# Patient Record
Sex: Female | Born: 1990 | Hispanic: No | Marital: Single | Smoking: Never smoker
Health system: Southern US, Community
[De-identification: ages and names within clinical notes are randomized; demographics above are authoritative.]

## PROBLEM LIST (undated history)

## (undated) DIAGNOSIS — K219 Gastro-esophageal reflux disease without esophagitis: Secondary | ICD-10-CM

## (undated) DIAGNOSIS — T7840XA Allergy, unspecified, initial encounter: Secondary | ICD-10-CM

## (undated) DIAGNOSIS — K589 Irritable bowel syndrome without diarrhea: Secondary | ICD-10-CM

## (undated) HISTORY — DX: Allergy, unspecified, initial encounter: T78.40XA

## (undated) HISTORY — DX: Irritable bowel syndrome, unspecified: K58.9

---

## 2013-03-30 DIAGNOSIS — D649 Anemia, unspecified: Secondary | ICD-10-CM | POA: Insufficient documentation

## 2014-08-21 ENCOUNTER — Ambulatory Visit (INDEPENDENT_AMBULATORY_CARE_PROVIDER_SITE_OTHER): Payer: BC Managed Care – PPO | Admitting: Family Medicine

## 2014-08-21 VITALS — BP 106/70 | HR 94 | Temp 98.4°F | Resp 20 | Ht 63.25 in | Wt 167.1 lb

## 2014-08-21 DIAGNOSIS — S91009A Unspecified open wound, unspecified ankle, initial encounter: Secondary | ICD-10-CM

## 2014-08-21 DIAGNOSIS — IMO0002 Reserved for concepts with insufficient information to code with codable children: Secondary | ICD-10-CM

## 2014-08-21 DIAGNOSIS — S81801A Unspecified open wound, right lower leg, initial encounter: Principal | ICD-10-CM

## 2014-08-21 DIAGNOSIS — S91001A Unspecified open wound, right ankle, initial encounter: Principal | ICD-10-CM

## 2014-08-21 DIAGNOSIS — S81001A Unspecified open wound, right knee, initial encounter: Secondary | ICD-10-CM

## 2014-08-21 DIAGNOSIS — S81809A Unspecified open wound, unspecified lower leg, initial encounter: Secondary | ICD-10-CM

## 2014-08-21 DIAGNOSIS — S80829A Blister (nonthermal), unspecified lower leg, initial encounter: Secondary | ICD-10-CM

## 2014-08-21 DIAGNOSIS — S81009A Unspecified open wound, unspecified knee, initial encounter: Secondary | ICD-10-CM

## 2014-08-21 MED ORDER — SILVER SULFADIAZINE 1 % EX CREA: 1.0000 "application " | TOPICAL_CREAM | Freq: Every day | CUTANEOUS | Status: DC

## 2014-08-21 NOTE — Progress Notes (Signed)
Subjective:  This chart was scribed for Shade Flood, MD by Elveria Rising, Medial Scribe. This patient was seen in room 1 and the patient's care was started at 3:53 PM.    Patient ID: Catherine Freeman, female    DOB: July 27, 1991, 23 y.o.   MRN: [id number]  HPI HPI Comments: Catherine Freeman is a 23 y.o. female who presents to the Emergency Department complaining of leash burn to her right posterior leg at popliteal space incurred three days ago. Patient reports that she was walking her dog and he began chasing a squirrel. During the chase, his leash wrapped around her right leg twice resulting in two large superficial, open wounds. Patient reports treatment with peroxide, Neosporin, bandaging with gauze and ace wraps, and washing the wounds twice a day with soap and water. Patient has not taken pain medication because the pain is mild and tolerable. Patient is concerned about possible infection. Patient denies history MRSA.   There are no active problems to display for this patient.  Past Medical History  Diagnosis Date  . Allergy    History reviewed. No pertinent past surgical history. Allergies  Allergen Reactions  . Dust Mite Extract    Prior to Admission medications   Medication Sig Start Date End Date Taking? Authorizing Provider  cetirizine (ZYRTEC) 10 MG tablet Take 10 mg by mouth daily.   Yes Historical Provider, MD  norethindrone-ethinyl estradiol (JUNEL FE,GILDESS FE,LOESTRIN FE) 1-20 MG-MCG tablet Take 1 tablet by mouth daily.   Yes Historical Provider, MD   History   Social History  . Marital Status: Single    Spouse Name: N/A    Number of Children: N/A  . Years of Education: N/A   Occupational History  . Not on file.   Social History Main Topics  . Smoking status: Never Smoker   . Smokeless tobacco: Never Used  . Alcohol Use: No  . Drug Use: No  . Sexual Activity: Not on file   Other Topics Concern  . Not on file   Social History Narrative  . No narrative on  file    Review of Systems  Constitutional: Negative for fever and chills.  Skin: Positive for wound.  Neurological: Negative for numbness.      Objective:   Physical Exam  Nursing note and vitals reviewed. Constitutional: She is oriented to person, place, and time. She appears well-developed and well-nourished.  HENT:  Head: Normocephalic and atraumatic.  Eyes: EOM are normal.  Neck: Neck supple.  Cardiovascular: Normal rate.   Pulmonary/Chest: Effort normal.  Musculoskeletal: Normal range of motion.  Neurological: She is alert and oriented to person, place, and time.  Skin: Skin is warm and dry.  Posterior aspect in popliteal fossa: two abraisons with slight clear, adherent discharge. Upper wound is 10cm x 2 cm with few small faint lateral abrasions on lateral aspset.  Lower wound is approximately 18cm x 2cm with one small blister that is 1.5cm x 1cm on inferior lateral aspect.   Psychiatric: She has a normal mood and affect. Her behavior is normal.   Filed Vitals:   08/21/14 1503  BP: 106/70  Pulse: 94  Temp: 98.4 F (36.9 C)  TempSrc: Oral  Resp: 20  Height: 5' 3.25" (1.607 m)  Weight: 167 lb 2 oz (75.807 kg)  SpO2: 98%       Assessment & Plan:   Catherine Freeman is a 23 y.o. female Open wound of knee, leg (except thigh), and ankle, complicated, right, initial  encounter - Plan: Wound culture, silver sulfADIAZINE (SILVADENE) 1 % cream  Blister of leg - Plan: Wound culture, silver sulfADIAZINE (SILVADENE) 1 % cream  Leash wound/abrasion.  No surrounding erythema.  1 small bulla laterally opened with 11 blade, clear fluid expressed. Silvadene applied and bandaged. Wound care discussed below. rtc precautions.   Meds ordered this encounter  Medications  . cetirizine (ZYRTEC) 10 MG tablet    Sig: Take 10 mg by mouth daily.  . norethindrone-ethinyl estradiol (JUNEL FE,GILDESS FE,LOESTRIN FE) 1-20 MG-MCG tablet    Sig: Take 1 tablet by mouth daily.  . silver sulfADIAZINE  (SILVADENE) 1 % cream    Sig: Apply 1 application topically daily.    Dispense:  50 g    Refill:  0   Patient Instructions  Soap and water cleansing of area once per day, cover with silvadene cream and change bandage once per day. After 1 week, can change to polysporin once per day until healed.  If any redness around wound or worsening of area - recheck.   Return to the clinic or go to the nearest emergency room if any of your symptoms worsen or new symptoms occur. Abrasion An abrasion is a cut or scrape of the skin. Abrasions do not extend through all layers of the skin and most heal within 10 days. It is important to care for your abrasion properly to prevent infection. CAUSES  Most abrasions are caused by falling on, or gliding across, the ground or other surface. When your skin rubs on something, the outer and inner layer of skin rubs off, causing an abrasion. DIAGNOSIS  Your caregiver will be able to diagnose an abrasion during a physical exam.  TREATMENT  Your treatment depends on how large and deep the abrasion is. Generally, your abrasion will be cleaned with water and a mild soap to remove any dirt or debris. An antibiotic ointment may be put over the abrasion to prevent an infection. A bandage (dressing) may be wrapped around the abrasion to keep it from getting dirty.  You may need a tetanus shot if:  You cannot remember when you had your last tetanus shot.  You have never had a tetanus shot.  The injury broke your skin. If you get a tetanus shot, your arm may swell, get red, and feel warm to the touch. This is common and not a problem. If you need a tetanus shot and you choose not to have one, there is a rare chance of getting tetanus. Sickness from tetanus can be serious.  HOME CARE INSTRUCTIONS   If a dressing was applied, change it at least once a day or as directed by your caregiver. If the bandage sticks, soak it off with warm water.   Wash the area with water and a  mild soap to remove all the ointment 2 times a day. Rinse off the soap and pat the area dry with a clean towel.   Reapply any ointment as directed by your caregiver. This will help prevent infection and keep the bandage from sticking. Use gauze over the wound and under the dressing to help keep the bandage from sticking.   Change your dressing right away if it becomes wet or dirty.   Only take over-the-counter or prescription medicines for pain, discomfort, or fever as directed by your caregiver.   Follow up with your caregiver within 24-48 hours for a wound check, or as directed. If you were not given a wound-check appointment, look closely at  your abrasion for redness, swelling, or pus. These are signs of infection. SEEK IMMEDIATE MEDICAL CARE IF:   You have increasing pain in the wound.   You have redness, swelling, or tenderness around the wound.   You have pus coming from the wound.   You have a fever or persistent symptoms for more than 2-3 days.  You have a fever and your symptoms suddenly get worse.  You have a bad smell coming from the wound or dressing.  MAKE SURE YOU:   Understand these instructions.  Will watch your condition.  Will get help right away if you are not doing well or get worse. Document Released: 09/12/2005 Document Revised: 11/19/2012 Document Reviewed: 11/06/2011 [facility] Patient Information 2015 [place], [place]. This information is not intended to replace advice given to you by your health care provider. Make sure you discuss any questions you have with your health care provider.     I personally performed the services described in this documentation, which was scribed in my presence. The recorded information has been reviewed and considered, and addended by me as needed.

## 2014-08-21 NOTE — Patient Instructions (Addendum)
Soap and water cleansing of area once per day, cover with silvadene cream and change bandage once per day. After 1 week, can change to polysporin once per day until healed.  If any redness around wound or worsening of area - recheck.   Return to the clinic or go to the nearest emergency room if any of your symptoms worsen or new symptoms occur. Abrasion An abrasion is a cut or scrape of the skin. Abrasions do not extend through all layers of the skin and most heal within 10 days. It is important to care for your abrasion properly to prevent infection. CAUSES  Most abrasions are caused by falling on, or gliding across, the ground or other surface. When your skin rubs on something, the outer and inner layer of skin rubs off, causing an abrasion. DIAGNOSIS  Your caregiver will be able to diagnose an abrasion during a physical exam.  TREATMENT  Your treatment depends on how large and deep the abrasion is. Generally, your abrasion will be cleaned with water and a mild soap to remove any dirt or debris. An antibiotic ointment may be put over the abrasion to prevent an infection. A bandage (dressing) may be wrapped around the abrasion to keep it from getting dirty.  You may need a tetanus shot if:  You cannot remember when you had your last tetanus shot.  You have never had a tetanus shot.  The injury broke your skin. If you get a tetanus shot, your arm may swell, get red, and feel warm to the touch. This is common and not a problem. If you need a tetanus shot and you choose not to have one, there is a rare chance of getting tetanus. Sickness from tetanus can be serious.  HOME CARE INSTRUCTIONS   If a dressing was applied, change it at least once a day or as directed by your caregiver. If the bandage sticks, soak it off with warm water.   Wash the area with water and a mild soap to remove all the ointment 2 times a day. Rinse off the soap and pat the area dry with a clean towel.   Reapply any  ointment as directed by your caregiver. This will help prevent infection and keep the bandage from sticking. Use gauze over the wound and under the dressing to help keep the bandage from sticking.   Change your dressing right away if it becomes wet or dirty.   Only take over-the-counter or prescription medicines for pain, discomfort, or fever as directed by your caregiver.   Follow up with your caregiver within 24-48 hours for a wound check, or as directed. If you were not given a wound-check appointment, look closely at your abrasion for redness, swelling, or pus. These are signs of infection. SEEK IMMEDIATE MEDICAL CARE IF:   You have increasing pain in the wound.   You have redness, swelling, or tenderness around the wound.   You have pus coming from the wound.   You have a fever or persistent symptoms for more than 2-3 days.  You have a fever and your symptoms suddenly get worse.  You have a bad smell coming from the wound or dressing.  MAKE SURE YOU:   Understand these instructions.  Will watch your condition.  Will get help right away if you are not doing well or get worse. Document Released: 09/12/2005 Document Revised: 11/19/2012 Document Reviewed: 11/06/2011 [facility] Patient Information 2015 [place], [place]. This information is not intended to replace advice given  to you by your health care provider. Make sure you discuss any questions you have with your health care provider.  

## 2014-08-25 LAB — WOUND CULTURE
Gram Stain: NONE SEEN
Gram Stain: NONE SEEN
Gram Stain: NONE SEEN
Organism ID, Bacteria: NO GROWTH

## 2015-01-03 ENCOUNTER — Ambulatory Visit: Payer: Self-pay | Admitting: Internal Medicine

## 2015-01-14 ENCOUNTER — Encounter: Payer: Self-pay | Admitting: Internal Medicine

## 2015-01-14 ENCOUNTER — Ambulatory Visit: Payer: Self-pay | Admitting: Internal Medicine

## 2015-01-14 ENCOUNTER — Ambulatory Visit (INDEPENDENT_AMBULATORY_CARE_PROVIDER_SITE_OTHER): Payer: 59 | Admitting: Internal Medicine

## 2015-01-14 ENCOUNTER — Other Ambulatory Visit (INDEPENDENT_AMBULATORY_CARE_PROVIDER_SITE_OTHER): Payer: 59

## 2015-01-14 VITALS — BP 116/66 | HR 73 | Temp 98.3°F | Resp 14 | Ht 63.0 in | Wt 172.0 lb

## 2015-01-14 DIAGNOSIS — R1084 Generalized abdominal pain: Secondary | ICD-10-CM

## 2015-01-14 DIAGNOSIS — Z202 Contact with and (suspected) exposure to infections with a predominantly sexual mode of transmission: Secondary | ICD-10-CM

## 2015-01-14 DIAGNOSIS — J3089 Other allergic rhinitis: Secondary | ICD-10-CM

## 2015-01-14 LAB — COMPREHENSIVE METABOLIC PANEL
ALK PHOS: 89 U/L (ref 39–117)
ALT: 26 U/L (ref 0–35)
AST: 18 U/L (ref 0–37)
Albumin: 4 g/dL (ref 3.5–5.2)
BUN: 7 mg/dL (ref 6–23)
CALCIUM: 9.1 mg/dL (ref 8.4–10.5)
CO2: 26 meq/L (ref 19–32)
Chloride: 105 mEq/L (ref 96–112)
Creatinine, Ser: 0.6 mg/dL (ref 0.40–1.20)
GFR: 129.81 mL/min (ref >60.00)
Glucose, Bld: 84 mg/dL (ref 70–99)
Potassium: 4.1 mEq/L (ref 3.5–5.1)
Sodium: 137 mEq/L (ref 135–145)
TOTAL PROTEIN: 7.2 g/dL (ref 6.0–8.3)
Total Bilirubin: 0.3 mg/dL (ref 0.2–1.2)

## 2015-01-14 LAB — LIPID PANEL
Cholesterol: 135 mg/dL (ref 0–200)
HDL: 39.5 mg/dL (ref >39.00)
LDL Cholesterol: 82 mg/dL (ref 0–99)
NONHDL: 95.5
Total CHOL/HDL Ratio: 3
Triglycerides: 66 mg/dL (ref 0.0–149.0)
VLDL: 13.2 mg/dL (ref 0.0–40.0)

## 2015-01-14 LAB — CBC
HEMATOCRIT: 40 % (ref 36.0–46.0)
Hemoglobin: 13.6 g/dL (ref 12.0–15.0)
MCHC: 34 g/dL (ref 30.0–36.0)
MCV: 84.7 fl (ref 78.0–100.0)
Platelets: 280 10*3/uL (ref 150.0–400.0)
RBC: 4.72 Mil/uL (ref 3.87–5.11)
RDW: 13.7 % (ref 11.5–15.5)
WBC: 9 10*3/uL (ref 4.0–10.5)

## 2015-01-14 NOTE — Progress Notes (Signed)
Pre visit review using our clinic review tool, if applicable. No additional management support is needed unless otherwise documented below in the visit note. 

## 2015-01-14 NOTE — Patient Instructions (Signed)
We will check the blood and the urine today for STDs including HIV. You are doing a good job with using protection which can protect against STDs as birth control only protects against pregnancy and not STDs.   We will check some blood work for the stomach and if that is normal we will order an ultrasound of the stomach to check on all the organs.   We can do your pap smears and the next one that you need can be next year.   We will call you back about your blood work. Come back next year for a check up and if you have any problems or questions before then please call our office.   Health Maintenance -  SCHOOL PERFORMANCE After high school, you may attend college or technical or vocational school, enroll in the [employer], or enter the workforce. PHYSICAL, SOCIAL, AND EMOTIONAL DEVELOPMENT  One hour of regular physical activity daily is recommended. Continue to participate in sports.  Develop your own interests and consider community service or volunteerism.  Make decisions about college and work plans.  Throughout these years, you should assume responsibility for your own health care. Increasing independence is important for you.  You may be exploring your sexual identity. Understand that you should never be in a situation that makes you feel uncomfortable, and tell your partner if you do not want to engage in sexual activity.  Body image may become important to you. Be mindful that eating disorders can develop at this time. Talk to your parents or other caregivers if you have concerns about body image, weight gain, or losing weight.  You may notice mood disturbances, depression, anxiety, attention problems, or trouble with alcohol. Talk to your health care provider if you have concerns about mental illness.  Set limits for yourself and talk with your parents or other caregivers about independent decision making.  Handle conflict without physical violence.  Avoid loud noises which may  impair hearing.  Limit television and computer time to 2 hours each day. Individuals who engage in excessive inactivity are more likely to become overweight. RECOMMENDED IMMUNIZATIONS  Influenza vaccine.  All adults should be immunized every year.  All adults, including pregnant women and people with hives-only allergy to eggs, can receive the inactivated influenza (IIV) vaccine.  Adults aged 18-49 years can receive the recombinant influenza (RIV) vaccine. The RIV vaccine does not contain any egg protein.  Tetanus, diphtheria, and acellular pertussis (Td, Tdap) vaccine.  Pregnant women should receive 1 dose of Tdap vaccine during each pregnancy. The dose should be obtained regardless of the length of time since the last dose. Immunization is preferred during the 27th to 36th week of gestation.  An adult who has not previously received Tdap or who does not know his or her vaccine status should receive 1 dose of Tdap. This initial dose should be followed by tetanus and diphtheria toxoids (Td) booster doses every 10 years.  Adults with an unknown or incomplete history of completing a 3-dose immunization series with Td-containing vaccines should begin or complete a primary immunization series including a Tdap dose.  Adults should receive a Td booster every 10 years.  Varicella vaccine.  An adult without evidence of immunity to varicella should receive 2 doses or a second dose if he or she has previously received 1 dose.  Pregnant females who do not have evidence of immunity should receive the first dose after pregnancy. This first dose should be obtained before leaving the health care  facility. The second dose should be obtained 4-8 weeks after the first dose.  Human papillomavirus (HPV) vaccine.  Females aged 13-26 years who have not received the vaccine previously should obtain the 3-dose series.  The vaccine is not recommended for pregnant females. However, pregnancy testing is not  needed before receiving a dose. If a female is found to be pregnant after receiving a dose, no treatment is needed. In that case, the remaining doses should be delayed until after the pregnancy.  Males aged 77-21 years who have not received the vaccine previously should receive the 3-dose series. Males aged 22-26 years may be immunized.  Immunization is recommended through the age of 19 years for any female who has sex with males and did not get any or all doses earlier.  Immunization is recommended for any person with an immunocompromised condition through the age of 55 years if he or she did not get any or all doses earlier.  During the 3-dose series, the second dose should be obtained 4-8 weeks after the first dose. The third dose should be obtained 24 weeks after the first dose and 16 weeks after the second dose.  Measles, mumps, and rubella (MMR) vaccine.  Adults born in [birth year] or later should have 1 or more doses of MMR vaccine unless there is a contraindication to the vaccine or there is laboratory evidence of immunity to each of the three diseases.  A routine second dose of MMR vaccine should be obtained at least 28 days after the first dose for students attending postsecondary schools, health care workers, and international travelers.  For females of childbearing age, rubella immunity should be determined. If there is no evidence of immunity, females who are not pregnant should be vaccinated. If there is no evidence of immunity, females who are pregnant should delay immunization until after pregnancy.  Pneumococcal 13-valent conjugate (PCV13) vaccine.  When indicated, a person who is uncertain of his or her immunization history and has no record of immunization should receive the PCV13 vaccine.  An adult aged 76 years or older who has certain medical conditions and has not been previously immunized should receive 1 dose of PCV13 vaccine. This PCV13 should be followed with a dose of  pneumococcal polysaccharide (PPSV23) vaccine. The PPSV23 vaccine dose should be obtained at least 8 weeks after the dose of PCV13 vaccine.  An adult aged 68 years or older who has certain medical conditions and previously received 1 or more doses of PPSV23 vaccine should receive 1 dose of PCV13. The PCV13 vaccine dose should be obtained 1 or more years after the last PPSV23 vaccine dose.  Pneumococcal polysaccharide (PPSV23) vaccine.  When PCV13 is also indicated, PCV13 should be obtained first.  An adult younger than age 66 years who has certain medical conditions should be immunized.  Any person who resides in a long-term care facility should be immunized.  An adult smoker should be immunized.  People with an immunocompromised condition and certain other conditions should receive both PCV13 and PPSV23 vaccines.  People with human immunodeficiency virus (HIV) infection should be immunized as soon as possible after diagnosis.  Immunization during chemotherapy or radiation therapy should be avoided.  Routine use of PPSV23 vaccine is not recommended for American Indians, [place] Natives, or people younger than 65 years unless there are medical conditions that require PPSV23 vaccine.  When indicated, people who have unknown immunization and have no record of immunization should receive PPSV23 vaccine.  One-time revaccination 5 years after  the first dose of PPSV23 is recommended for people aged 19-64 years who have chronic kidney failure, nephrotic syndrome, asplenia, or immunocompromised conditions.  Meningococcal vaccine.  Adults with asplenia or persistent complement component deficiencies should receive 2 doses of quadrivalent meningococcal conjugate (MenACWY-D) vaccine. The doses should be obtained at least 2 months apart.  Microbiologists working with certain meningococcal bacteria, [employer] recruits, people at risk during an outbreak, and people who travel to or live in countries with  a high rate of meningitis should be immunized.  A first-year college student up through age 40 years who is living in a residence hall should receive a dose if he or she did not receive a dose on or after his or her 16th birthday.  Adults who have certain high-risk conditions should receive one or more doses of vaccine.  Hepatitis A vaccine.  Adults who wish to be protected from this disease, have certain high-risk conditions, work with hepatitis A-infected animals, work in hepatitis A research labs, or travel to or work in countries with a high rate of hepatitis A should be immunized.  Adults who were previously unvaccinated and who anticipate close contact with an international adoptee during the first 60 days after arrival in the [place] from a country with a high rate of hepatitis A should be immunized.  Hepatitis B vaccine.  Adults who wish to be protected from this disease, have certain high-risk conditions, may be exposed to blood or other infectious body fluids, are household contacts or sex partners of hepatitis B positive people, are clients or workers in certain care facilities, or travel to or work in countries with a high rate of hepatitis B should be immunized.  Haemophilus influenzae type b (Hib) vaccine.  A previously unvaccinated person with asplenia or sickle cell disease or having a scheduled splenectomy should receive 1 dose of Hib vaccine.  Regardless of previous immunization, a recipient of a hematopoietic stem cell transplant should receive a 3-dose series 6-12 months after his or her successful transplant.  Hib vaccine is not recommended for adults with HIV infection. TESTING  Annual screening for vision and hearing problems is recommended. Vision should be screened at least once between 57-71 years of age.  You may be screened for anemia or tuberculosis.  You should have a blood test to check for high cholesterol.  You should be screened for alcohol and  drug use.  If you are sexually active, you may be screened for sexually transmitted infections (STIs), pregnancy, or HIV. You should be screened for STIs if:  Your sexual activity has changed since the last screening test, and you are at an increased risk for chlamydia or gonorrhea. Ask your health care provider if you are at risk.  If you are at an increased risk for hepatitis B, you should be screened for this virus. You are considered at high risk for hepatitis B if you:  Were born in a country where hepatitis B occurs often. Talk with your health care provider about which countries are considered high risk.  Have parents who were born in a high-risk country and have not received a shot to protect against hepatitis B (hepatitis B vaccine).  Have HIV or AIDS.  Use needles to inject street drugs.  Live with or have sex with someone who has hepatitis B.  Are a man who has sex with other men (MSM).  Get hemodialysis treatment.  Take certain medicines for conditions like cancer, organ transplantation, or autoimmune conditions. NUTRITION  You should:  Have three servings of low-fat milk and dairy products daily. If you do not drink milk or consume dairy products, you should eat calcium-enriched foods, such as juice, bread, or cereal. Dark, leafy greens or canned fish are alternate sources of calcium.  Drink plenty of water. Fruit juice should be limited to 8-12 oz (240-360 mL) each day. Sugary beverages and sodas should be avoided.  Avoid eating foods high in fat, salt, or sugar, such as chips, candy, and cookies.  Avoid fast foods and limit eating out at restaurants.  Try not to skip meals, especially breakfast. You should eat a variety of vegetables, fruits, and lean meats.  Eat meals together as a family whenever possible. ORAL HEALTH Brush your teeth twice a day and floss at least once a day. You should have two dental exams a year.  SKIN CARE You should wear sunscreen when  out in the sun. TALK TO SOMEONE ABOUT:  Precautions against pregnancy, contraception, and sexually transmitted infections.  Taking a prescription medicine daily to prevent HIV infection if you are at risk of being infected with HIV. This is called preexposure prophylaxis (PrEP). You are at risk if you:  Are a female who has sex with other males (MSM).  Are heterosexual and sexually active with more than one partner.  Take drugs by injection.  Are sexually active with a partner who has HIV.  Whether you are at high risk of being infected with HIV. If you choose to begin PrEP, you should first be tested for HIV. You should then be tested every 3 months for as long as you are taking PrEP.  Drug, tobacco, and alcohol use among your friends or at friends' homes. Smoking tobacco or marijuana and taking drugs have health consequences and may impact your brain development.  Appropriate use of over-the-counter or prescription medicines.  Driving guidelines and riding with friends.  The risks of drinking and driving or boating. Call someone if you have been drinking or using drugs and need a ride. WHAT'S NEXT? Visit your pediatrician or family physician once a year. By young adulthood, you should transition from your pediatrician to a family physician or internal medicine specialist. If you are a female and are sexually active, you may want to begin annual physical exams with a gynecologist. Document Released: 02/28/2007 Document Revised: 12/08/2013 Document Reviewed: 03/20/2007 [facility] Patient Information 2015 [place], [place]. This information is not intended to replace advice given to you by your health care provider. Make sure you discuss any questions you have with your health care provider.

## 2015-01-15 ENCOUNTER — Encounter: Payer: Self-pay | Admitting: Internal Medicine

## 2015-01-15 DIAGNOSIS — K219 Gastro-esophageal reflux disease without esophagitis: Secondary | ICD-10-CM | POA: Insufficient documentation

## 2015-01-15 DIAGNOSIS — Z202 Contact with and (suspected) exposure to infections with a predominantly sexual mode of transmission: Secondary | ICD-10-CM | POA: Insufficient documentation

## 2015-01-15 DIAGNOSIS — J309 Allergic rhinitis, unspecified: Secondary | ICD-10-CM | POA: Insufficient documentation

## 2015-01-15 LAB — GC/CHLAMYDIA PROBE AMP, URINE
CHLAMYDIA, SWAB/URINE, PCR: NEGATIVE
GC PROBE AMP, URINE: NEGATIVE

## 2015-01-15 LAB — HIV ANTIBODY (ROUTINE TESTING W REFLEX): HIV: NONREACTIVE

## 2015-01-15 NOTE — Assessment & Plan Note (Signed)
Sounds more consistent with gallstones although GERD is in the differential as well as other abdominal processes. Will check CMP, lipid. If no abnormalities will check abdominal US. If no abnormality in US will trial PPI if she knows she will be eating something that could set her off.

## 2015-01-15 NOTE — Assessment & Plan Note (Signed)
Sees an allergist and using xyzal and stable at this time without symptoms.

## 2015-01-15 NOTE — Progress Notes (Signed)
   Subjective:    Patient ID: Catherine Freeman, female    DOB: 07-21-1991, 24 y.o.   MRN: [id number]  HPI The patient is a new patient coming in to discuss her generalized abdominal pain. She has had 3 episodes since October that have been precipitated by eating out or bad foods. She states that the pain lasts all day and affects her whole stomach. Not associated with bloating, gas. Not relieved by defecation. Associated with nausea but no vomiting. She has not tried anything for it. She also wants to be checked for STDs although she only has 1 partner and generally uses protection.   PMH, SMH, FMH, allergies, medications, problem list reviewed and updated.    Review of Systems  Constitutional: Negative for fever, activity change, appetite change, fatigue and unexpected weight change.  HENT: Negative.   Eyes: Negative.   Respiratory: Negative for cough, chest tightness, shortness of breath and wheezing.   Cardiovascular: Negative for chest pain, palpitations and leg swelling.  Gastrointestinal: Positive for nausea and abdominal pain. Negative for vomiting, diarrhea, constipation, blood in stool and abdominal distention.       During episode  Genitourinary: Negative.   Musculoskeletal: Negative.   Skin: Negative.   Neurological: Negative.   Psychiatric/Behavioral: Negative.       Objective:   Physical Exam  Constitutional: She is oriented to person, place, and time. She appears well-developed and well-nourished.  HENT:  Head: Normocephalic and atraumatic.  Eyes: EOM are normal.  Neck: Normal range of motion.  Cardiovascular: Normal rate and regular rhythm.   Pulmonary/Chest: Effort normal and breath sounds normal. No respiratory distress. She has no wheezes. She has no rales.  Abdominal: Soft. Bowel sounds are normal. She exhibits no distension and no mass. There is no tenderness.  Musculoskeletal: Normal range of motion. She exhibits no edema.  Neurological: She is alert and oriented to  person, place, and time.  Skin: Skin is warm and dry.  Psychiatric: She has a normal mood and affect. Her behavior is normal.   Filed Vitals:   01/14/15 1312  BP: 116/66  Pulse: 73  Temp: 98.3 F (36.8 C)  TempSrc: Oral  Resp: 14  Height: 5\' 3"  (1.6 m)  Weight: 172 lb (78.019 kg)  SpO2: 97%      Assessment & Plan:

## 2015-01-15 NOTE — Assessment & Plan Note (Signed)
Currently with single partner, check GC/chlamydia, HIV.

## 2015-01-17 ENCOUNTER — Other Ambulatory Visit (INDEPENDENT_AMBULATORY_CARE_PROVIDER_SITE_OTHER): Payer: 59

## 2015-01-17 ENCOUNTER — Encounter: Payer: Self-pay | Admitting: Nurse Practitioner

## 2015-01-17 ENCOUNTER — Ambulatory Visit (INDEPENDENT_AMBULATORY_CARE_PROVIDER_SITE_OTHER): Payer: 59 | Admitting: Nurse Practitioner

## 2015-01-17 VITALS — BP 102/70 | HR 70 | Temp 98.4°F | Ht 63.0 in | Wt 172.2 lb

## 2015-01-17 DIAGNOSIS — R1013 Epigastric pain: Secondary | ICD-10-CM

## 2015-01-17 LAB — H. PYLORI ANTIBODY, IGG: H PYLORI IGG: NEGATIVE

## 2015-01-17 NOTE — Progress Notes (Signed)
   Subjective:    Patient ID: Catherine Freeman, female    DOB: 02-May-1991, 24 y.o.   MRN: [id number]  HPI Comments: Pt saw Dr Dorise HissKollar 3 da. Patient states she returned today because pain became more constant over weekend & she will not be able to get off work in several weeks. Reviewed Dr Lavonna MonarchKollar's note dated 01/14/15 & labs-all nml.  Abdominal Pain This is a recurrent problem. Episode onset: 4 mos, 3 episodes. The onset quality is gradual (seems to occur after eating). The problem occurs constantly (pain became constant over weekend). Duration: 1 to 12 hrs. The problem has been waxing and waning. The pain is located in the epigastric region. The pain is moderate. The quality of the pain is dull. The abdominal pain does not radiate. Associated symptoms include nausea. Pertinent negatives include no anorexia, belching, constipation, diarrhea, dysuria, fever, frequency or vomiting. The pain is aggravated by eating. The pain is relieved by nothing (tried ibuprophen & pepto bismol). The treatment provided no relief. Prior workup: sti testing neg, cmet lipids, cbc nml.      Review of Systems  Constitutional: Negative for fever and appetite change.  Respiratory: Negative for cough.   Gastrointestinal: Positive for nausea and abdominal pain. Negative for vomiting, diarrhea, constipation, abdominal distention and anorexia.       Has daily formed brown BM  Genitourinary: Negative for dysuria, frequency and menstrual problem.       Regular 30 day MC, takes OBCP, has not missed any pills.  Neurological:       Has occasional HA, takes ibuprophen rarely with relief.       Objective:   Physical Exam  Constitutional: She is oriented to person, place, and time. She appears well-developed and well-nourished. No distress.  HENT:  Head: Normocephalic and atraumatic.  Eyes: Conjunctivae are normal. Right eye exhibits no discharge. Left eye exhibits no discharge.  Wears glasses   Cardiovascular: Normal rate,  regular rhythm and normal heart sounds.   No murmur heard. Pulmonary/Chest: Effort normal and breath sounds normal. No respiratory distress. She has no wheezes. She has no rales.  Abdominal: Soft. Bowel sounds are normal. She exhibits no distension and no mass. There is tenderness (epigastric). There is no rebound and no guarding.  Neurological: She is alert and oriented to person, place, and time.  Skin: Skin is warm and dry.  Psychiatric: She has a normal mood and affect. Her behavior is normal. Thought content normal.  Vitals reviewed.         Assessment & Plan:  1. Abdominal pain, epigastric DD: GB disease, PUD, gastritis - US Abdomen Complete; Future - H. pylori antibody, IgG  F/u Dr Dorise HissKollar in 1 mo

## 2015-01-17 NOTE — Progress Notes (Signed)
Pre visit review using our clinic review tool, if applicable. No additional management support is needed unless otherwise documented below in the visit note. 

## 2015-01-17 NOTE — Patient Instructions (Signed)
I suspect gastritis or gall bladder disease.  Please get abdominal US to help determine cause for pain.  My office will call with results and follow up plan.  Please schedule appointment with Dr Dorise HissKollar in 1 month.

## 2015-01-18 ENCOUNTER — Other Ambulatory Visit: Payer: Self-pay | Admitting: Geriatric Medicine

## 2015-01-18 ENCOUNTER — Emergency Department (HOSPITAL_COMMUNITY): Payer: 59

## 2015-01-18 ENCOUNTER — Emergency Department (HOSPITAL_COMMUNITY)
Admission: EM | Admit: 2015-01-18 | Discharge: 2015-01-18 | Disposition: A | Discharge status: Home / Self Care | Payer: 59 | Attending: Emergency Medicine | Admitting: Emergency Medicine

## 2015-01-18 ENCOUNTER — Telehealth: Payer: Self-pay | Admitting: Internal Medicine

## 2015-01-18 ENCOUNTER — Encounter (HOSPITAL_COMMUNITY): Payer: Self-pay | Admitting: Emergency Medicine

## 2015-01-18 DIAGNOSIS — K2970 Gastritis, unspecified, without bleeding: Secondary | ICD-10-CM

## 2015-01-18 DIAGNOSIS — R109 Unspecified abdominal pain: Secondary | ICD-10-CM

## 2015-01-18 DIAGNOSIS — Z79899 Other long term (current) drug therapy: Secondary | ICD-10-CM | POA: Insufficient documentation

## 2015-01-18 DIAGNOSIS — R1013 Epigastric pain: Secondary | ICD-10-CM | POA: Diagnosis present

## 2015-01-18 LAB — CBC WITH DIFFERENTIAL/PLATELET
Basophils Absolute: 0 10*3/uL (ref 0.0–0.1)
Basophils Relative: 1 % (ref 0–1)
Eosinophils Absolute: 0.2 10*3/uL (ref 0.0–0.7)
Eosinophils Relative: 2 % (ref 0–5)
HCT: 40.7 % (ref 36.0–46.0)
Hemoglobin: 13.4 g/dL (ref 12.0–15.0)
LYMPHS ABS: 1.9 10*3/uL (ref 0.7–4.0)
LYMPHS PCT: 25 % (ref 12–46)
MCH: 29 pg (ref 26.0–34.0)
MCHC: 32.9 g/dL (ref 30.0–36.0)
MCV: 88.1 fL (ref 78.0–100.0)
MONO ABS: 0.5 10*3/uL (ref 0.1–1.0)
Monocytes Relative: 6 % (ref 3–12)
NEUTROS ABS: 5.3 10*3/uL (ref 1.7–7.7)
Neutrophils Relative %: 66 % (ref 43–77)
Platelets: 273 10*3/uL (ref 150–400)
RBC: 4.62 MIL/uL (ref 3.87–5.11)
RDW: 13.2 % (ref 11.5–15.5)
WBC: 7.9 10*3/uL (ref 4.0–10.5)

## 2015-01-18 LAB — LIPASE, BLOOD: LIPASE: 27 U/L (ref 11–59)

## 2015-01-18 LAB — COMPREHENSIVE METABOLIC PANEL
ALBUMIN: 3.9 g/dL (ref 3.5–5.2)
ALK PHOS: 84 U/L (ref 39–117)
ALT: 22 U/L (ref 0–35)
AST: 18 U/L (ref 0–37)
Anion gap: 6 (ref 5–15)
BUN: 8 mg/dL (ref 6–23)
CO2: 24 mmol/L (ref 19–32)
CREATININE: 0.51 mg/dL (ref 0.50–1.10)
Calcium: 9.1 mg/dL (ref 8.4–10.5)
Chloride: 109 mmol/L (ref 96–112)
GFR calc Af Amer: >90 mL/min (ref >90)
GFR calc non Af Amer: >90 mL/min (ref >90)
GLUCOSE: 93 mg/dL (ref 70–99)
Potassium: 3.6 mmol/L (ref 3.5–5.1)
SODIUM: 139 mmol/L (ref 135–145)
TOTAL PROTEIN: 7.6 g/dL (ref 6.0–8.3)
Total Bilirubin: 0.6 mg/dL (ref 0.3–1.2)

## 2015-01-18 MED ORDER — SUCRALFATE 1 G PO TABS: 1.0000 g | ORAL_TABLET | Freq: Three times a day (TID) | ORAL | Status: DC

## 2015-01-18 MED ORDER — RANITIDINE HCL 150 MG PO TABS: 150.0000 mg | ORAL_TABLET | Freq: Two times a day (BID) | ORAL | Status: DC

## 2015-01-18 MED ORDER — GI COCKTAIL ~~LOC~~
30.0000 mL | Freq: Once | ORAL | Status: AC
  Administered 2015-01-18: 30 mL via ORAL
  Filled 2015-01-18: qty 30

## 2015-01-18 NOTE — ED Provider Notes (Addendum)
CSN: [id number]     Arrival date & time 01/18/15  0801 History   First MD Initiated Contact with Patient 01/18/15 0818     Chief Complaint  Patient presents with  . Abdominal Pain     (Consider location/radiation/quality/duration/timing/severity/associated sxs/prior Treatment) Patient is a 24 y.o. female presenting with abdominal pain. The history is provided by the patient.  Abdominal Pain Pain location:  Epigastric and RUQ Pain quality: aching, gnawing and shooting   Pain radiates to:  Back Pain severity:  Severe Onset quality:  Gradual Duration:  4 days Timing:  Constant Progression:  Worsening Chronicity:  Recurrent Context: eating   Context comment:  For the last 3-4 months patient had had intermittent epigastric pain with eating fried foods that would resolve however has been constant since sat Relieved by:  Nothing Worsened by:  Eating Ineffective treatments:  Antacids and NSAIDs Associated symptoms: anorexia and nausea   Associated symptoms: no constipation, no cough, no diarrhea, no dysuria, no fever and no vomiting   Risk factors: no alcohol abuse   Risk factors comment:  Patient saw PCP on Friday and had normal labs done at that time. Soft PA on Monday and had a negative H. pylori test. Had an ultrasound scheduled however pain has continued to get worse   Past Medical History  Diagnosis Date  . Allergy    History reviewed. No pertinent past surgical history. Family History  Problem Relation Age of Onset  . Diabetes Father   . Diabetes Sister    History  Substance Use Topics  . Smoking status: Never Smoker   . Smokeless tobacco: Never Used  . Alcohol Use: No   OB History    No data available     Review of Systems  Constitutional: Negative for fever.  Respiratory: Negative for cough.   Gastrointestinal: Positive for nausea, abdominal pain and anorexia. Negative for vomiting, diarrhea and constipation.  Genitourinary: Negative for dysuria.  All other  systems reviewed and are negative.     Allergies  Dust mite extract  Home Medications   Prior to Admission medications   Medication Sig Start Date End Date Taking? Authorizing Provider  ibuprofen (ADVIL,MOTRIN) 200 MG tablet Take 600 mg by mouth every 6 (six) hours as needed for headache, moderate pain or cramping.   Yes Historical Provider, MD  levocetirizine (XYZAL) 5 MG tablet Take 5 mg by mouth at bedtime.  01/12/15  Yes Historical Provider, MD  norethindrone-ethinyl estradiol (JUNEL FE,GILDESS FE,LOESTRIN FE) 1-20 MG-MCG tablet Take 1 tablet by mouth daily.   Yes Historical Provider, MD   BP 118/75 mmHg  Pulse 97  Temp(Src) 98.2 F (36.8 C) (Oral)  Resp 16  SpO2 99%  LMP 12/29/2014 Physical Exam  Constitutional: She is oriented to person, place, and time. She appears well-developed and well-nourished. No distress.  HENT:  Head: Normocephalic and atraumatic.  Eyes: EOM are normal. Pupils are equal, round, and reactive to light.  Cardiovascular: Normal rate, regular rhythm, normal heart sounds and intact distal pulses.  Exam reveals no friction rub.   No murmur heard. Pulmonary/Chest: Effort normal and breath sounds normal. She has no wheezes. She has no rales.  Abdominal: Soft. Normal appearance and bowel sounds are normal. She exhibits no distension. There is tenderness in the right upper quadrant and epigastric area. There is no rebound, no guarding and negative Murphy's sign.    Musculoskeletal: Normal range of motion. She exhibits no tenderness.  No edema  Neurological: She is alert and  oriented to person, place, and time. No cranial nerve deficit.  Skin: Skin is warm and dry. No rash noted.  Psychiatric: She has a normal mood and affect. Her behavior is normal.  Nursing note and vitals reviewed.   ED Course  Procedures (including critical care time) Labs Review Labs Reviewed  CBC WITH DIFFERENTIAL/PLATELET  COMPREHENSIVE METABOLIC PANEL  LIPASE, BLOOD     Imaging Review US Abdomen Complete  01/18/2015   CLINICAL DATA:  Upper abdominal pain since October 2015, recently worsening.  EXAM: ULTRASOUND ABDOMEN COMPLETE  COMPARISON:  None.  FINDINGS: Gallbladder: No gallstones or wall thickening visualized. No sonographic Murphy sign noted.  Common bile duct: Diameter: 4 mm  Liver: No focal lesion identified. Within normal limits in parenchymal echogenicity.  IVC: No abnormality visualized.  Pancreas: Visualized portion unremarkable.  Spleen: Size and appearance within normal limits.  Right Kidney: Length: 11 cm. Echogenicity within normal limits. No mass or hydronephrosis visualized.  Left Kidney: Length: 11.6 cm. Echogenicity within normal limits. No mass or hydronephrosis visualized.  Abdominal aorta: No aneurysm visualized.  Other findings: None.  IMPRESSION: Normal abdominal ultrasound.   Electronically Signed   By: Tiburcio Pea M.D.   On: 01/18/2015 09:42     EKG Interpretation None      MDM   Final diagnoses:  Abdominal pain  Gastritis   Patient with intermittent epigastric pain over the last several months usually worse after fried meal however the pain has been persistent since Saturday and now is spreading out and radiating to her back. Nausea without vomiting. No fever. Concern for possible cholelithiasis versus gastritis. Patient saw her PCP on Friday and Monday. At that time 4 days ago her CBC, CMP were within normal limits. She had an H. pylori done yesterday which was negative.  Will repeat CBC and CMP due to and now persistent pain as well as lipase. Right upper quadrant ultrasound pending. Patient given GI cocktail as she does not want anything else for pain  9:59 AM Labs are within normal limits an ultrasound is negative. Will treat patient for gastritis  Gwyneth Sprout, MD 01/18/15 8119  Gwyneth Sprout, MD 01/18/15 1004

## 2015-01-18 NOTE — Telephone Encounter (Signed)
Spoke with patient. She will send refill request to pharmacy.

## 2015-01-18 NOTE — ED Notes (Signed)
US at bedside

## 2015-01-18 NOTE — ED Notes (Signed)
Pt c/o epigastric pain and nausea intermittently since October, saw PCP on Friday and on Monday with no firm diagnosis, possibly gastritis or gall stones, pain worsened and became constant onset this weekend. Denies emesis, diarrhea.

## 2015-01-18 NOTE — Telephone Encounter (Signed)
Pt request to speak to the assistant concern about medication, please give her a call

## 2015-01-25 ENCOUNTER — Encounter: Payer: Self-pay | Admitting: Internal Medicine

## 2015-01-25 ENCOUNTER — Ambulatory Visit (INDEPENDENT_AMBULATORY_CARE_PROVIDER_SITE_OTHER): Payer: 59 | Admitting: Internal Medicine

## 2015-01-25 VITALS — BP 136/78 | HR 75 | Temp 98.3°F | Resp 16 | Ht 63.0 in | Wt 173.0 lb

## 2015-01-25 DIAGNOSIS — K219 Gastro-esophageal reflux disease without esophagitis: Secondary | ICD-10-CM

## 2015-01-25 MED ORDER — RANITIDINE HCL 150 MG PO TABS: 150.0000 mg | ORAL_TABLET | Freq: Two times a day (BID) | ORAL | Status: DC

## 2015-01-25 NOTE — Patient Instructions (Addendum)
We will have you start using the carafate twice a day (with breakfast and evening meal). Give it about 3-4 days. If you are still doing well you can go down to once a day for 2-3 days, then you can stop if you are doing well. '  If the symptoms come back go back to taking the last amount that was doing well. Keep taking the zantac twice a day.  Here is some information about foods that tend to make stomach problems worse.   Food Choices for Gastroesophageal Reflux Disease When you have gastroesophageal reflux disease (GERD), the foods you eat and your eating habits are very important. Choosing the right foods can help ease the discomfort of GERD. WHAT GENERAL GUIDELINES DO I NEED TO FOLLOW?  Choose fruits, vegetables, whole grains, low-fat dairy products, and low-fat meat, fish, and poultry.  Limit fats such as oils, salad dressings, butter, nuts, and avocado.  Keep a food diary to identify foods that cause symptoms.  Avoid foods that cause reflux. These may be different for different people.  Eat frequent small meals instead of three large meals each day.  Eat your meals slowly, in a relaxed setting.  Limit fried foods.  Cook foods using methods other than frying.  Avoid drinking alcohol.  Avoid drinking large amounts of liquids with your meals.  Avoid bending over or lying down until 2-3 hours after eating. WHAT FOODS ARE NOT RECOMMENDED? The following are some foods and drinks that may worsen your symptoms: Vegetables Tomatoes. Tomato juice. Tomato and spaghetti sauce. Chili peppers. Onion and garlic. Horseradish. Fruits Oranges, grapefruit, and lemon (fruit and juice). Meats High-fat meats, fish, and poultry. This includes hot dogs, ribs, ham, sausage, salami, and bacon. Dairy Whole milk and chocolate milk. Sour cream. Cream. Butter. Ice cream. Cream cheese.  Beverages Coffee and tea, with or without caffeine. Carbonated beverages or energy drinks. Condiments Hot  sauce. Barbecue sauce.  Sweets/Desserts Chocolate and cocoa. Donuts. Peppermint and spearmint. Fats and Oils High-fat foods, including [place] fries and potato chips. Other Vinegar. Strong spices, such as black pepper, white pepper, red pepper, cayenne, curry powder, cloves, ginger, and chili powder. The items listed above may not be a complete list of foods and beverages to avoid. Contact your dietitian for more information. Document Released: 12/03/2005 Document Revised: 12/08/2013 Document Reviewed: 10/07/2013 [facility] Patient Information 2015 [place], [place]. This information is not intended to replace advice given to you by your health care provider. Make sure you discuss any questions you have with your health care provider.

## 2015-01-25 NOTE — Progress Notes (Signed)
Pre visit review using our clinic review tool, if applicable. No additional management support is needed unless otherwise documented below in the visit note. 

## 2015-01-25 NOTE — Progress Notes (Signed)
   Subjective:    Patient ID: Catherine Freeman, female    DOB: 1991-11-16, 24 y.o.   MRN: [id number]  HPI The patient is a 24 YO female coming in to follow up on her stomach pain. She had another episode since our last visit and sought care in the ED. They did some blood work and US of her stomach. They did not find any abnormalities and she was started on carafate and zantac. She has not had any recurrence since then. She is taking the carafate 4 times a day. She denies abdominal pain, nausea, vomiting. She denies associated fevers. She does not eat dairy but thinks the last episode of pain was after frozen yogurt. The episode lasts about 2-3 hours and was severe 10/10 intensity. No pain now.  Review of Systems  Constitutional: Negative for fever, activity change, appetite change, fatigue and unexpected weight change.  HENT: Negative.   Eyes: Negative.   Respiratory: Negative for cough, chest tightness, shortness of breath and wheezing.   Cardiovascular: Negative for chest pain, palpitations and leg swelling.  Gastrointestinal: Negative for nausea, vomiting, abdominal pain, diarrhea, constipation, blood in stool and abdominal distention.  Genitourinary: Negative.   Musculoskeletal: Negative.   Skin: Negative.   Neurological: Negative.   Psychiatric/Behavioral: Negative.       Objective:   Physical Exam  Constitutional: She is oriented to person, place, and time. She appears well-developed and well-nourished.  HENT:  Head: Normocephalic and atraumatic.  Eyes: EOM are normal.  Neck: Normal range of motion.  Cardiovascular: Normal rate and regular rhythm.   Pulmonary/Chest: Effort normal and breath sounds normal. No respiratory distress. She has no wheezes. She has no rales.  Abdominal: Soft. Bowel sounds are normal. She exhibits no distension and no mass. There is no tenderness.  Musculoskeletal: Normal range of motion. She exhibits no edema.  Neurological: She is alert and oriented to  person, place, and time.  Skin: Skin is warm and dry.  Psychiatric: She has a normal mood and affect. Her behavior is normal.   Filed Vitals:   01/25/15 1101  BP: 136/78  Pulse: 75  Temp: 98.3 F (36.8 C)  TempSrc: Oral  Resp: 16  Height: 5\' 3"  (1.6 m)  Weight: 173 lb (78.472 kg)  SpO2: 96%      Assessment & Plan:

## 2015-01-25 NOTE — Assessment & Plan Note (Signed)
US reveals no abnormalities. She has had total 4-5 episodes. Advised she can wean off carafate. Continue with zantac. Advised to keep track of foods that bother her stomach to see if we can find a pattern.

## 2015-02-16 DIAGNOSIS — Z0279 Encounter for issue of other medical certificate: Secondary | ICD-10-CM

## 2015-04-08 ENCOUNTER — Encounter (HOSPITAL_COMMUNITY): Payer: Self-pay

## 2015-04-08 ENCOUNTER — Emergency Department (HOSPITAL_COMMUNITY)
Admission: EM | Admit: 2015-04-08 | Discharge: 2015-04-08 | Disposition: A | Discharge status: Home / Self Care | Payer: 59 | Attending: Emergency Medicine | Admitting: Emergency Medicine

## 2015-04-08 DIAGNOSIS — K219 Gastro-esophageal reflux disease without esophagitis: Secondary | ICD-10-CM | POA: Diagnosis not present

## 2015-04-08 DIAGNOSIS — Z3202 Encounter for pregnancy test, result negative: Secondary | ICD-10-CM | POA: Diagnosis not present

## 2015-04-08 DIAGNOSIS — Z793 Long term (current) use of hormonal contraceptives: Secondary | ICD-10-CM | POA: Insufficient documentation

## 2015-04-08 DIAGNOSIS — R1013 Epigastric pain: Secondary | ICD-10-CM

## 2015-04-08 DIAGNOSIS — Z79899 Other long term (current) drug therapy: Secondary | ICD-10-CM | POA: Insufficient documentation

## 2015-04-08 HISTORY — DX: Gastro-esophageal reflux disease without esophagitis: K21.9

## 2015-04-08 LAB — COMPREHENSIVE METABOLIC PANEL
ALT: 28 U/L (ref 0–35)
ANION GAP: 5 (ref 5–15)
AST: 19 U/L (ref 0–37)
Albumin: 4 g/dL (ref 3.5–5.2)
Alkaline Phosphatase: 92 U/L (ref 39–117)
BUN: 9 mg/dL (ref 6–23)
CALCIUM: 8.7 mg/dL (ref 8.4–10.5)
CO2: 23 mmol/L (ref 19–32)
Chloride: 107 mmol/L (ref 96–112)
Creatinine, Ser: 0.58 mg/dL (ref 0.50–1.10)
Glucose, Bld: 84 mg/dL (ref 70–99)
Potassium: 3.7 mmol/L (ref 3.5–5.1)
Sodium: 135 mmol/L (ref 135–145)
TOTAL PROTEIN: 7.8 g/dL (ref 6.0–8.3)
Total Bilirubin: 0.5 mg/dL (ref 0.3–1.2)

## 2015-04-08 LAB — CBC WITH DIFFERENTIAL/PLATELET
BASOS PCT: 0 % (ref 0–1)
Basophils Absolute: 0 10*3/uL (ref 0.0–0.1)
EOS ABS: 0.1 10*3/uL (ref 0.0–0.7)
Eosinophils Relative: 1 % (ref 0–5)
HCT: 40.8 % (ref 36.0–46.0)
Hemoglobin: 13.4 g/dL (ref 12.0–15.0)
Lymphocytes Relative: 22 % (ref 12–46)
Lymphs Abs: 1.8 10*3/uL (ref 0.7–4.0)
MCH: 28.7 pg (ref 26.0–34.0)
MCHC: 32.8 g/dL (ref 30.0–36.0)
MCV: 87.4 fL (ref 78.0–100.0)
MONOS PCT: 6 % (ref 3–12)
Monocytes Absolute: 0.5 10*3/uL (ref 0.1–1.0)
NEUTROS ABS: 5.8 10*3/uL (ref 1.7–7.7)
NEUTROS PCT: 71 % (ref 43–77)
PLATELETS: 289 10*3/uL (ref 150–400)
RBC: 4.67 MIL/uL (ref 3.87–5.11)
RDW: 13.2 % (ref 11.5–15.5)
WBC: 8.2 10*3/uL (ref 4.0–10.5)

## 2015-04-08 LAB — URINE MICROSCOPIC-ADD ON

## 2015-04-08 LAB — URINALYSIS, ROUTINE W REFLEX MICROSCOPIC
GLUCOSE, UA: NEGATIVE mg/dL
KETONES UR: NEGATIVE mg/dL
Leukocytes, UA: NEGATIVE
NITRITE: NEGATIVE
Protein, ur: NEGATIVE mg/dL
SPECIFIC GRAVITY, URINE: 1.02 (ref 1.005–1.030)
Urobilinogen, UA: 0.2 mg/dL (ref 0.0–1.0)
pH: 7.5 (ref 5.0–8.0)

## 2015-04-08 LAB — LIPASE, BLOOD: Lipase: 27 U/L (ref 11–59)

## 2015-04-08 LAB — POC URINE PREG, ED: PREG TEST UR: NEGATIVE

## 2015-04-08 MED ORDER — GI COCKTAIL ~~LOC~~
30.0000 mL | Freq: Once | ORAL | Status: AC
  Administered 2015-04-08: 30 mL via ORAL
  Filled 2015-04-08: qty 30

## 2015-04-08 MED ORDER — PANTOPRAZOLE SODIUM 20 MG PO TBEC: 20.0000 mg | DELAYED_RELEASE_TABLET | Freq: Every day | ORAL | Status: DC

## 2015-04-08 MED ORDER — SODIUM CHLORIDE 0.9 % IV BOLUS (SEPSIS)
1000.0000 mL | Freq: Once | INTRAVENOUS | Status: AC
  Administered 2015-04-08: 1000 mL via INTRAVENOUS

## 2015-04-08 NOTE — ED Notes (Signed)
Pt c/o upper abdominal "burning" and intermittent dizziness x 2 days.  Pain score 6/10.  Denies n/v/d.  Pt reports previous similar symptoms and was diagnosed w/ GERD.  Sts she has only been eating "the good foods."

## 2015-04-08 NOTE — Discharge Instructions (Signed)
Read the information below.  You may return to the Emergency Department at any time for worsening condition or any new symptoms that concern you.  If you develop high fevers, worsening abdominal pain, uncontrolled vomiting, or are unable to tolerate fluids by mouth, return to the ER for a recheck.  You may try over the counter medications such as Maalox or Mylanta as needed for indigestion.  Please follow up with your primary care provider if symptoms continue.    Abdominal Pain Many things can cause abdominal pain. Usually, abdominal pain is not caused by a disease and will improve without treatment. It can often be observed and treated at home. Your health care provider will do a physical exam and possibly order blood tests and X-rays to help determine the seriousness of your pain. However, in many cases, more time must pass before a clear cause of the pain can be found. Before that point, your health care provider may not know if you need more testing or further treatment. HOME CARE INSTRUCTIONS  Monitor your abdominal pain for any changes. The following actions may help to alleviate any discomfort you are experiencing:  Only take over-the-counter or prescription medicines as directed by your health care provider.  Do not take laxatives unless directed to do so by your health care provider.  Try a clear liquid diet (broth, tea, or water) as directed by your health care provider. Slowly move to a bland diet as tolerated. SEEK MEDICAL CARE IF:  You have unexplained abdominal pain.  You have abdominal pain associated with nausea or diarrhea.  You have pain when you urinate or have a bowel movement.  You experience abdominal pain that wakes you in the night.  You have abdominal pain that is worsened or improved by eating food.  You have abdominal pain that is worsened with eating fatty foods.  You have a fever. SEEK IMMEDIATE MEDICAL CARE IF:   Your pain does not go away within 2  hours.  You keep throwing up (vomiting).  Your pain is felt only in portions of the abdomen, such as the right side or the left lower portion of the abdomen.  You pass bloody or black tarry stools. MAKE SURE YOU:  Understand these instructions.   Will watch your condition.   Will get help right away if you are not doing well or get worse.  Document Released: 09/12/2005 Document Revised: 12/08/2013 Document Reviewed: 08/12/2013 [facility]Exit[facility] Patient Information 2015 [place], [place]. This information is not intended to replace advice given to you by your health care provider. Make sure you discuss any questions you have with your health care provider.

## 2015-04-08 NOTE — ED Provider Notes (Signed)
CSN: [id number]     Arrival date & time 04/08/15  1008 History   First MD Initiated Contact with Patient 04/08/15 1019     Chief Complaint  Patient presents with  . Abdominal Pain  . Dizziness     (Consider location/radiation/quality/duration/timing/severity/associated sxs/prior Treatment) The history is provided by the patient and medical records.     Pt with hx GERD p/w 3 days constant burning epigastric pain, no exacerbating or palliative factors.  Takes ranitidine every day.  Has also felt lightheaded for three days, this is also constant with no exacerbating or palliative factors.  Has not been sleeping well for the past 1.5 weeks and has had increased stress at work.  Is eating and drinking well, following rules closely for GERD (avoids fried foods, citrus, acid), denies NSAIDs, ETOH, late night eating.  Was seen in two months ago in ED for similar symptoms with negative labs and US, follow up with PCP, weaned from carafate, ranitidine continued.  Denies fevers, N/V/D, change in stools including hematochezia and melena.  Denies vaginal and urinary symptoms.  LMP 3.5 weeks ago, on time and normal.  Did have abdominal surgery as a baby, insure of what this was.    Past Medical History  Diagnosis Date  . Allergy   . GERD (gastroesophageal reflux disease)    History reviewed. No pertinent past surgical history. Family History  Problem Relation Age of Onset  . Diabetes Father   . Diabetes Sister    History  Substance Use Topics  . Smoking status: Never Smoker   . Smokeless tobacco: Never Used  . Alcohol Use: Yes     Comment: socially   OB History    No data available     Review of Systems  All other systems reviewed and are negative.     Allergies  Dust mite extract  Home Medications   Prior to Admission medications   Medication Sig Start Date End Date Taking? Authorizing Provider  ibuprofen (ADVIL,MOTRIN) 200 MG tablet Take 600 mg by mouth every 6 (six) hours as  needed for headache, moderate pain or cramping.   Yes Historical Provider, MD  levocetirizine (XYZAL) 5 MG tablet Take 5 mg by mouth at bedtime.  01/12/15  Yes Historical Provider, MD  norethindrone-ethinyl estradiol (JUNEL FE,GILDESS FE,LOESTRIN FE) 1-20 MG-MCG tablet Take 1 tablet by mouth daily.   Yes Historical Provider, MD  ranitidine (ZANTAC) 150 MG tablet Take 1 tablet (150 mg total) by mouth 2 (two) times daily. 01/25/15  Yes Judie BonusElizabeth A Kollar, MD  sucralfate (CARAFATE) 1 G tablet Take 1 tablet (1 g total) by mouth 4 (four) times daily -  with meals and at bedtime. 01/18/15   Gwyneth SproutWhitney Plunkett, MD   BP 120/60 mmHg  Pulse 76  Temp(Src) 98.8 F (37.1 C) (Oral)  Resp 16  SpO2 97%  LMP 03/16/2015 Physical Exam  Constitutional: She appears well-developed and well-nourished. No distress.  HENT:  Head: Normocephalic and atraumatic.  Neck: Neck supple.  Cardiovascular: Normal rate and regular rhythm.   Pulmonary/Chest: Effort normal and breath sounds normal. No respiratory distress. She has no wheezes. She has no rales.  Abdominal: Soft. Bowel sounds are normal. She exhibits no distension. There is tenderness in the epigastric area and left upper quadrant. There is no rebound, no guarding and negative Murphy's sign.  Neurological: She is alert.  Skin: She is not diaphoretic.  Nursing note and vitals reviewed.   ED Course  Procedures (including critical care time) Labs  Review Labs Reviewed  URINALYSIS, ROUTINE W REFLEX MICROSCOPIC - Abnormal; Notable for the following:    APPearance CLOUDY (*)    Hgb urine dipstick TRACE (*)    Bilirubin Urine SMALL (*)    All other components within normal limits  URINE MICROSCOPIC-ADD ON - Abnormal; Notable for the following:    Squamous Epithelial / LPF MANY (*)    Bacteria, UA MANY (*)    All other components within normal limits  CBC WITH DIFFERENTIAL/PLATELET  COMPREHENSIVE METABOLIC PANEL  LIPASE, BLOOD  POC URINE PREG, ED    Imaging  Review No results found.   EKG Interpretation None       12:39 PM Patient reports complete relief with the GI cocktail.  Denies any pain or symptoms currently. Does not want to try carafate again.  Would prefer OTC medications like mylanta/maalox.  Will follow up with PCP.   1:46 PM I spoke with lab.  Urine analyzer is down, they are running samples by hand.  Sample will be processed shortly.   MDM   Final diagnoses:  Epigastric pain  Gastroesophageal reflux disease without esophagitis   Afebrile, nontoxic patient with epigastric burning pain and lightheadedness x 3 days.  Labs unremarkable.  Upreg negative.  UA negative, appears contaminated, pt without symptoms.  [place] reviewed from 2/2/016, negative, no gallstones.  GI cocktail given with complete relief.  IVF given.    D/C home with change from ranitidine to protonix, recommendations for OTC medications, PCP follow up.   Discussed result, findings, treatment, and follow up  with patient.  Pt given return precautions.  Pt verbalizes understanding and agrees with plan.        Trixie Dredge, PA-C 04/08/15 1423  Rolan Bucco, MD 04/08/15 [phone]

## 2015-04-25 DIAGNOSIS — Z0279 Encounter for issue of other medical certificate: Secondary | ICD-10-CM

## 2015-06-05 ENCOUNTER — Other Ambulatory Visit: Payer: Self-pay | Admitting: Internal Medicine

## 2015-07-22 ENCOUNTER — Ambulatory Visit: Payer: 59 | Admitting: Internal Medicine

## 2015-08-04 ENCOUNTER — Ambulatory Visit: Payer: 59 | Admitting: Internal Medicine

## 2015-08-26 ENCOUNTER — Ambulatory Visit (INDEPENDENT_AMBULATORY_CARE_PROVIDER_SITE_OTHER): Payer: 59 | Admitting: Internal Medicine

## 2015-08-26 ENCOUNTER — Encounter: Payer: Self-pay | Admitting: Internal Medicine

## 2015-08-26 VITALS — BP 98/68 | HR 83 | Temp 99.1°F | Resp 12 | Ht 63.0 in | Wt 161.4 lb

## 2015-08-26 DIAGNOSIS — K219 Gastro-esophageal reflux disease without esophagitis: Secondary | ICD-10-CM | POA: Diagnosis not present

## 2015-08-26 MED ORDER — PANTOPRAZOLE SODIUM 20 MG PO TBEC: 20.0000 mg | DELAYED_RELEASE_TABLET | Freq: Every day | ORAL | Status: DC

## 2015-08-26 NOTE — Progress Notes (Signed)
Pre visit review using our clinic review tool, if applicable. No additional management support is needed unless otherwise documented below in the visit note. 

## 2015-08-26 NOTE — Assessment & Plan Note (Signed)
Taking zantac regularly and will add protonix as needed for flares. If frequent flares she is to take daily. If still flares while taking daily needs GI referral and talked to her about this today. Again given information about foods that worsen GERD. Rx for protonix given today.

## 2015-08-26 NOTE — Patient Instructions (Addendum)
We have sent in a stronger medicine for the stomach called protonix. You can use it for a flare when you are starting to have pain. Take it daily until the flare is over. If you are having a lot of flares I suggest to take the protonix daily.   If you are taking the protonix daily and still having pain we need to send you to the stomach doctor to make sure you do not have ulcers in the stomach.   Food Choices for Gastroesophageal Reflux Disease When you have gastroesophageal reflux disease (GERD), the foods you eat and your eating habits are very important. Choosing the right foods can help ease the discomfort of GERD. WHAT GENERAL GUIDELINES DO I NEED TO FOLLOW?  Choose fruits, vegetables, whole grains, low-fat dairy products, and low-fat meat, fish, and poultry.  Limit fats such as oils, salad dressings, butter, nuts, and avocado.  Keep a food diary to identify foods that cause symptoms.  Avoid foods that cause reflux. These may be different for different people.  Eat frequent small meals instead of three large meals each day.  Eat your meals slowly, in a relaxed setting.  Limit fried foods.  Cook foods using methods other than frying.  Avoid drinking alcohol.  Avoid drinking large amounts of liquids with your meals.  Avoid bending over or lying down until 2-3 hours after eating. WHAT FOODS ARE NOT RECOMMENDED? The following are some foods and drinks that may worsen your symptoms: Vegetables Tomatoes. Tomato juice. Tomato and spaghetti sauce. Chili peppers. Onion and garlic. Horseradish. Fruits Oranges, grapefruit, and lemon (fruit and juice). Meats High-fat meats, fish, and poultry. This includes hot dogs, ribs, ham, sausage, salami, and bacon. Dairy Whole milk and chocolate milk. Sour cream. Cream. Butter. Ice cream. Cream cheese.  Beverages Coffee and tea, with or without caffeine. Carbonated beverages or energy drinks. Condiments Hot sauce. Barbecue sauce.   Sweets/Desserts Chocolate and cocoa. Donuts. Peppermint and spearmint. Fats and Oils High-fat foods, including [place] fries and potato chips. Other Vinegar. Strong spices, such as black pepper, white pepper, red pepper, cayenne, curry powder, cloves, ginger, and chili powder. The items listed above may not be a complete list of foods and beverages to avoid. Contact your dietitian for more information. Document Released: 12/03/2005 Document Revised: 12/08/2013 Document Reviewed: 10/07/2013 [facility] Patient Information 2015 [place], [place]. This information is not intended to replace advice given to you by your health care provider. Make sure you discuss any questions you have with your health care provider.

## 2015-08-26 NOTE — Progress Notes (Signed)
   Subjective:    Patient ID: Catherine Freeman, female    DOB: Mar 23, 1991, 24 y.o.   MRN: [id number]  HPI The patient is a 24 YO female coming in for worsening of her GERD. Previously she was well controlled on ranitidine. Seen at the ER months ago and given protonix and carafate. She took the carafate and that helped but did not know she was prescribed the protonix and never took. Since that time she has flares off an on with the GERD. Intense stomach pains intermittently not always related to foods. Better with tums but she wasn't sure it was safe to take that as well. Has been losing some weight due to not being able to tolerate certain foods. Denies diarrhea or constipation. Is having more frequent bowel movements when in flare (normal, no blood, not loose, max 3 per day).   Review of Systems  Constitutional: Negative for fever, activity change, appetite change, fatigue and unexpected weight change.  Respiratory: Negative for cough, chest tightness, shortness of breath and wheezing.   Cardiovascular: Negative for chest pain, palpitations and leg swelling.  Gastrointestinal: Positive for abdominal pain and abdominal distention. Negative for nausea, vomiting, diarrhea, constipation and blood in stool.  Musculoskeletal: Negative.   Skin: Negative.   Psychiatric/Behavioral: Negative.       Objective:   Physical Exam  Constitutional: She is oriented to person, place, and time. She appears well-developed and well-nourished.  HENT:  Head: Normocephalic and atraumatic.  Eyes: EOM are normal.  Neck: Normal range of motion.  Cardiovascular: Normal rate and regular rhythm.   Pulmonary/Chest: Effort normal and breath sounds normal. No respiratory distress. She has no wheezes. She has no rales.  Abdominal: Soft. Bowel sounds are normal. She exhibits no distension and no mass. There is no tenderness.  Musculoskeletal: Normal range of motion. She exhibits no edema.  Neurological: She is alert and oriented  to person, place, and time.  Skin: Skin is warm and dry.  Psychiatric: She has a normal mood and affect. Her behavior is normal.   Filed Vitals:   08/26/15 0947  BP: 98/68  Pulse: 83  Temp: 99.1 F (37.3 C)  TempSrc: Oral  Resp: 12  Height:  (1.6 m)  Weight: 161 lb 6.4 oz (73.211 kg)  SpO2: 99%      Assessment & Plan:

## 2015-09-09 ENCOUNTER — Telehealth: Payer: Self-pay | Admitting: Internal Medicine

## 2015-09-09 NOTE — Telephone Encounter (Signed)
Patient is advising that she is being told that a PA is needed for protonix. There is no note in the chart and no outstanding PA in your folder for this. Please follow up

## 2015-09-14 NOTE — Telephone Encounter (Signed)
Pt called to follow up on her PA for Protonix. She states we were supposed to submit this several weeks ago. Her insurance company is faxing [place] the form and she would like it completed as soon as possible.

## 2015-09-15 NOTE — Telephone Encounter (Signed)
Protonix is not covered but the generic pantotrazole The number to call for this is [phone] Pt has been waiting since Sept 9th for this PA

## 2015-09-19 ENCOUNTER — Other Ambulatory Visit: Payer: Self-pay | Admitting: Geriatric Medicine

## 2015-09-19 MED ORDER — PANTOPRAZOLE SODIUM 20 MG PO TBEC: 20.0000 mg | DELAYED_RELEASE_TABLET | Freq: Every day | ORAL | Status: DC

## 2015-09-19 NOTE — Telephone Encounter (Signed)
Pt states that it will need the PA. Can you please call her when you submit the PA, so she knows when its done.

## 2015-09-19 NOTE — Telephone Encounter (Signed)
Never received a PA. I sent rx to pharmacy and if a PA kicks back, I will submit it.

## 2015-09-21 ENCOUNTER — Telehealth: Payer: Self-pay

## 2015-09-21 DIAGNOSIS — Z0279 Encounter for issue of other medical certificate: Secondary | ICD-10-CM

## 2015-09-21 NOTE — Telephone Encounter (Signed)
covermymeds [place] [id number]

## 2015-09-22 NOTE — Telephone Encounter (Signed)
Approved through 09/13/16 Pharmacy notified

## 2015-10-07 ENCOUNTER — Other Ambulatory Visit: Payer: Self-pay | Admitting: Internal Medicine

## 2015-10-20 ENCOUNTER — Telehealth: Payer: Self-pay

## 2015-10-20 NOTE — Telephone Encounter (Signed)
PA initiated via covermymeds. ZOX:[id number]

## 2015-10-21 ENCOUNTER — Telehealth: Payer: Self-pay

## 2015-10-21 NOTE — Telephone Encounter (Signed)
PA initiated, PA approved.

## 2015-10-26 NOTE — Telephone Encounter (Signed)
PA approved.

## 2016-01-24 ENCOUNTER — Other Ambulatory Visit: Payer: Self-pay | Admitting: Internal Medicine

## 2016-03-06 IMAGING — US US ABDOMEN COMPLETE
1 series · 14 of 25 positions shown · non-contrast
Comparison: None.

CLINICAL DATA: Upper abdominal pain since September 2014, recently
worsening.

EXAM:
ULTRASOUND ABDOMEN COMPLETE

[Series 1: us abdomen complete · 0.18mm/px · 14 of 81 slices shown]
[im 1/81]
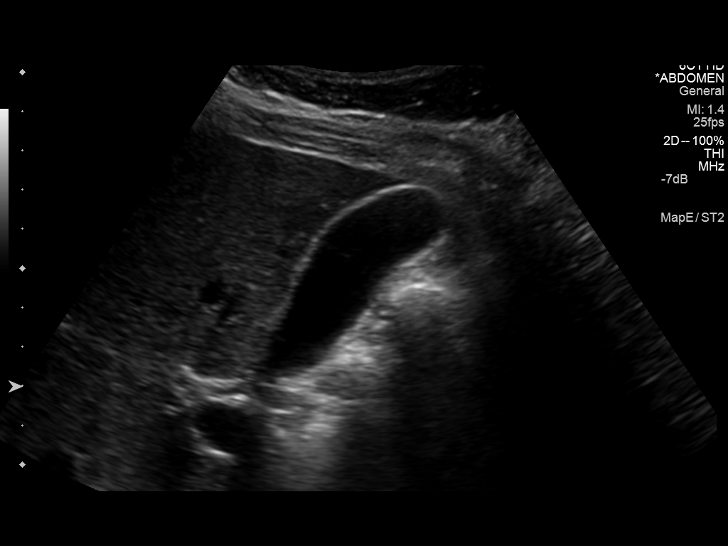
[im 7/81]
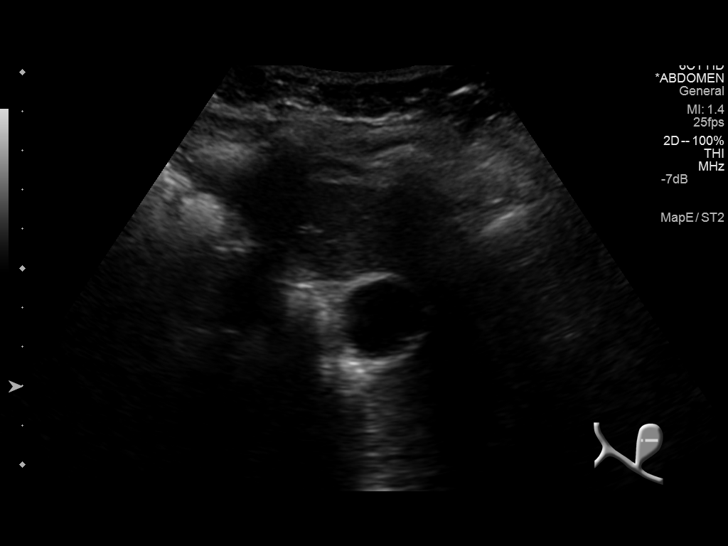
[im 14/81]
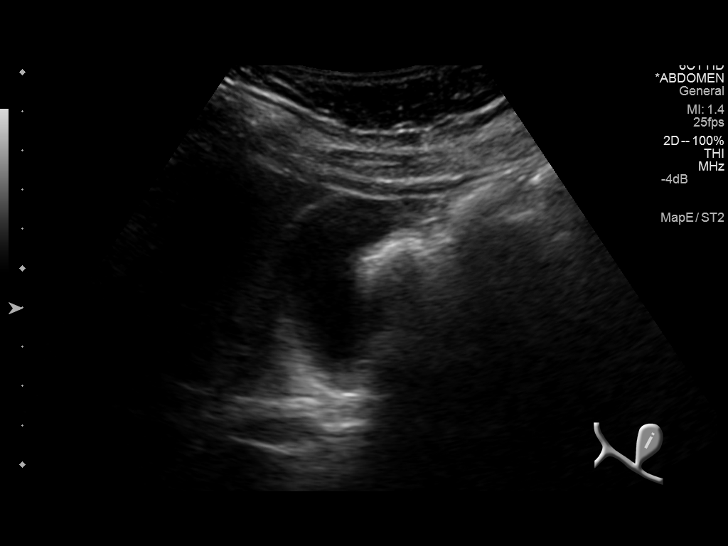
[im 21/81]
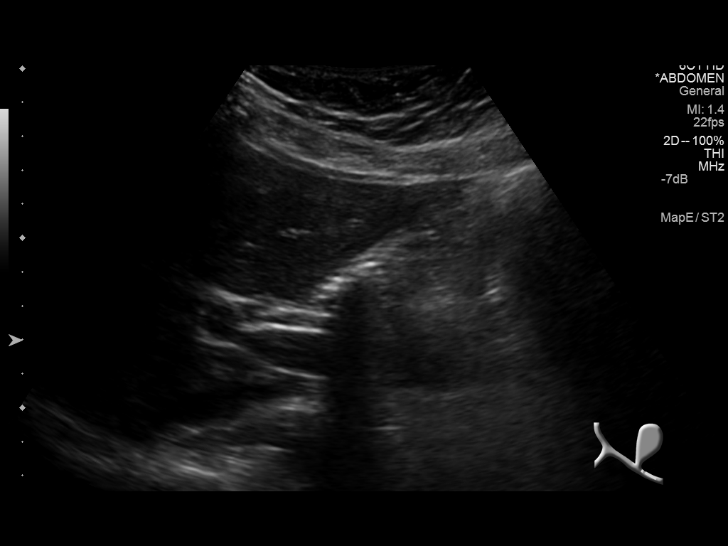
[im 27/81]
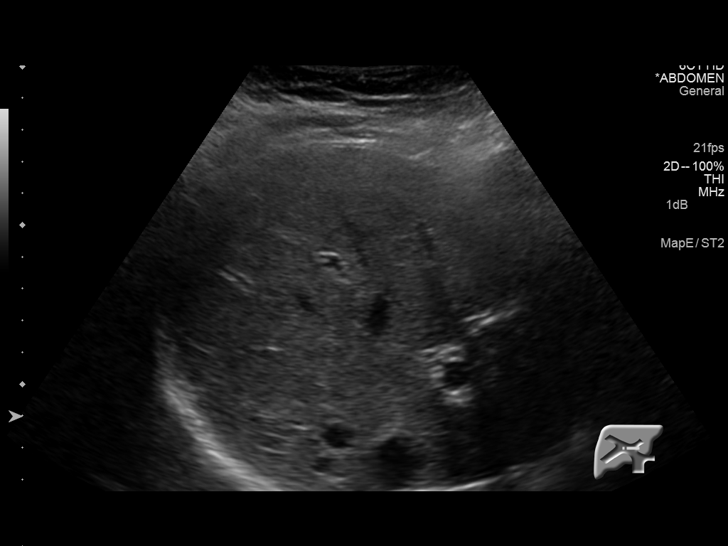
[im 31/81]
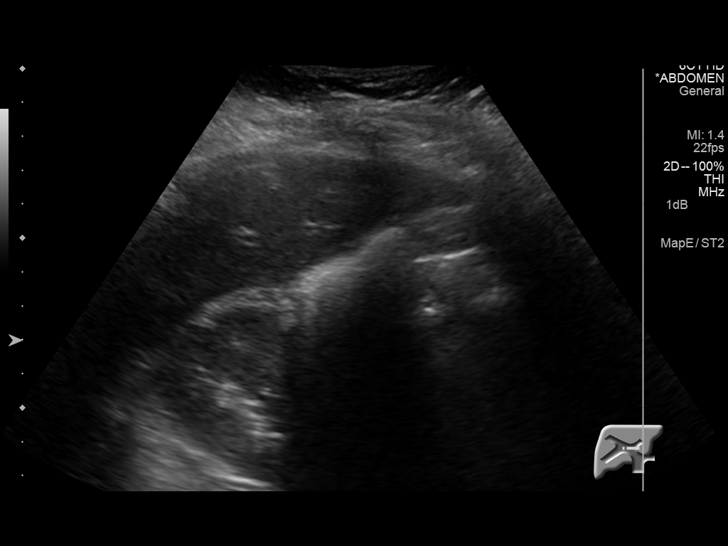
[im 37/81]
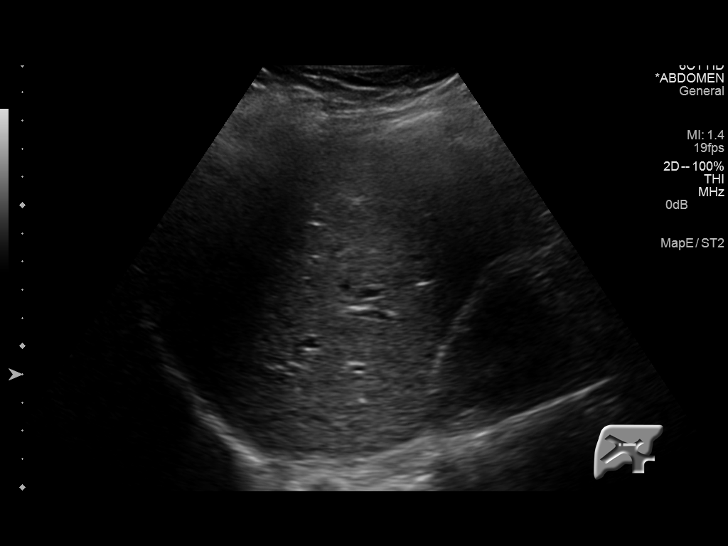
[im 44/81]
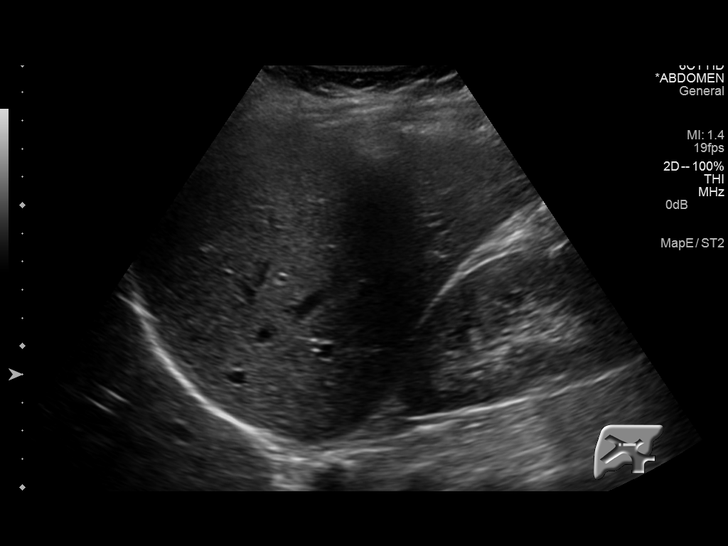
[im 51/81]
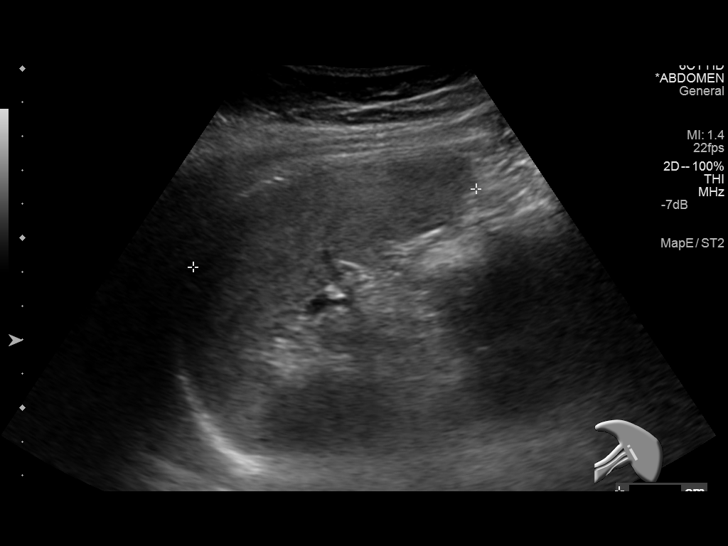
[im 54/81]
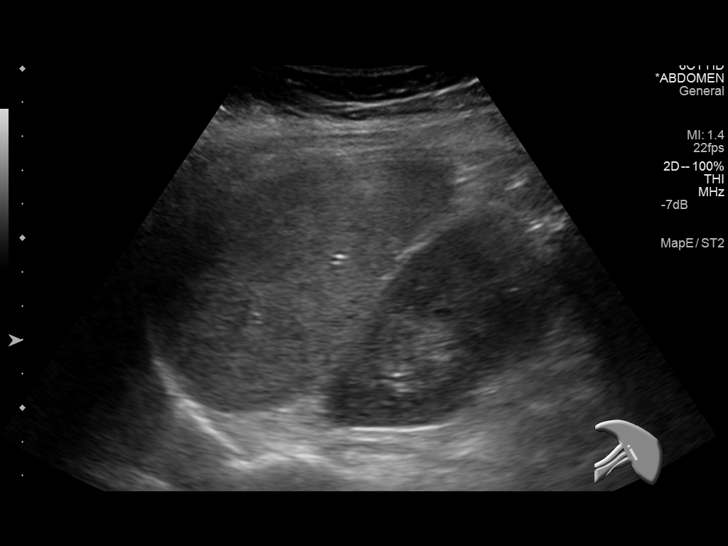
[im 61/81]
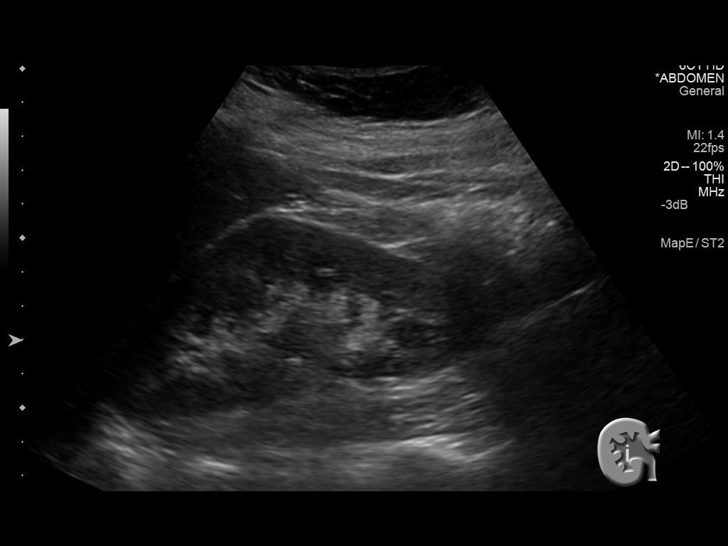
[im 67/81]
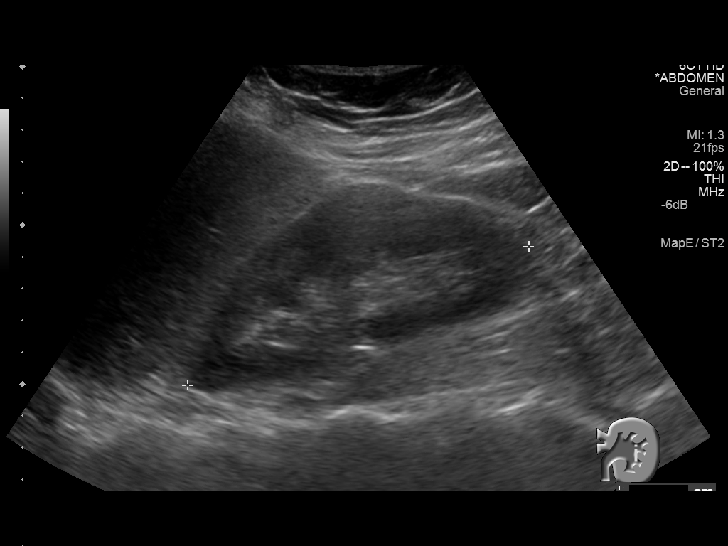
[im 74/81]
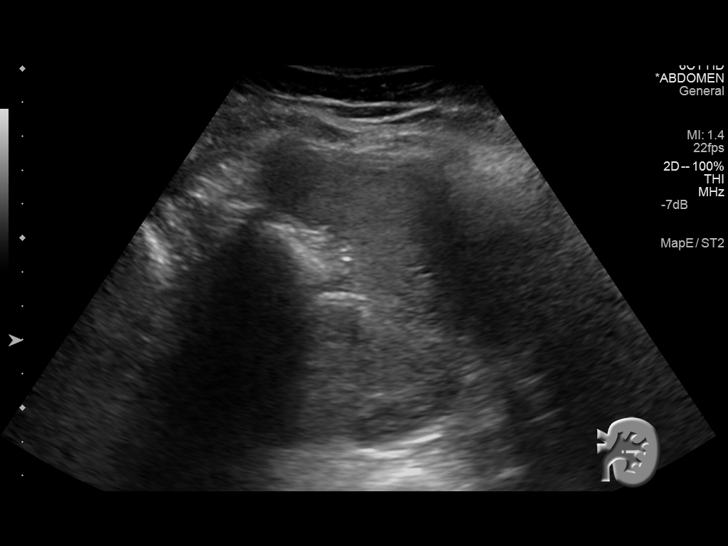
[im 81/81]
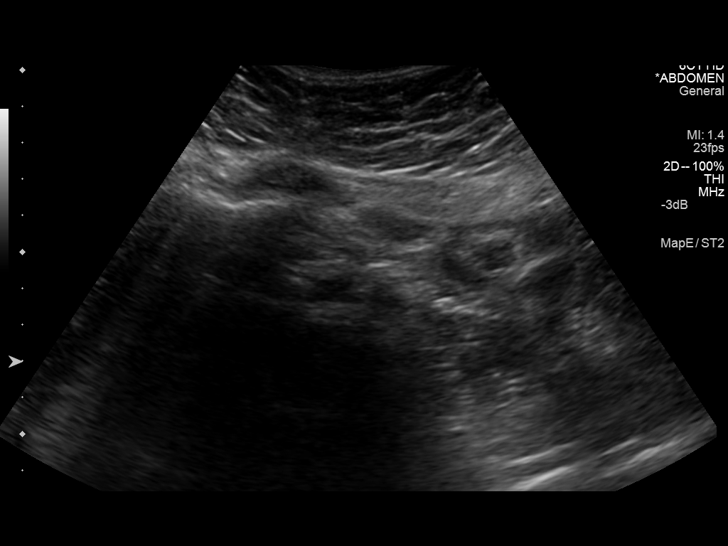

[14 of 25 positions shown; findings below may reference images not displayed]

FINDINGS: Gallbladder: No gallstones or wall thickening visualized. No
sonographic Murphy sign noted.

Common bile duct: Diameter: 4 mm

Liver: No focal lesion identified. Within normal limits in
parenchymal echogenicity.

IVC: No abnormality visualized.

Pancreas: Visualized portion unremarkable.

Spleen: Size and appearance within normal limits.

Right Kidney: Length: 11 cm. Echogenicity within normal limits. No
mass or hydronephrosis visualized.

Left Kidney: Length: 11.6 cm. Echogenicity within normal limits. No
mass or hydronephrosis visualized.

Abdominal aorta: No aneurysm visualized.

Other findings: None.
IMPRESSION: Normal abdominal ultrasound.

## 2016-04-10 ENCOUNTER — Ambulatory Visit: Payer: 59 | Admitting: Internal Medicine

## 2016-05-24 ENCOUNTER — Ambulatory Visit (INDEPENDENT_AMBULATORY_CARE_PROVIDER_SITE_OTHER): Payer: 59 | Admitting: Internal Medicine

## 2016-05-24 ENCOUNTER — Encounter: Payer: Self-pay | Admitting: Internal Medicine

## 2016-05-24 VITALS — BP 112/66 | HR 68 | Temp 98.6°F | Resp 14 | Ht 63.0 in | Wt 166.4 lb

## 2016-05-24 DIAGNOSIS — K219 Gastro-esophageal reflux disease without esophagitis: Secondary | ICD-10-CM | POA: Diagnosis not present

## 2016-05-24 DIAGNOSIS — K602 Anal fissure, unspecified: Secondary | ICD-10-CM | POA: Diagnosis not present

## 2016-05-24 MED ORDER — DILTIAZEM GEL 2 %
1.0000 | Freq: Three times a day (TID) | CUTANEOUS | Status: DC
Start: 2016-05-24 — End: 2016-06-25

## 2016-05-24 NOTE — Progress Notes (Signed)
Pre visit review using our clinic review tool, if applicable. No additional management support is needed unless otherwise documented below in the visit note. 

## 2016-05-24 NOTE — Assessment & Plan Note (Signed)
Rx for protonix and ranitidine is adequate and symptoms well controlled at this time.

## 2016-05-24 NOTE — Patient Instructions (Signed)
We have sent in the gel that you will use. Use a pea sized amount on the outside of the rectal area. Do that 3 times per day for 1 month. You should get better and better with time (likely within 2 weeks) but for full healing continue for the full month.   Anal Fissure, Adult An anal fissure is a small tear or crack in the skin around the anus. Bleeding from a fissure usually stops on its own within a few minutes. However, bleeding will often occur again with each bowel movement until the crack heals. CAUSES This condition may be caused by:  Passing large, hard stool (feces).  Frequent diarrhea.  Constipation.  Inflammatory bowel disease (Crohn disease or ulcerative colitis).  Infections.  Anal sex. SYMPTOMS Symptoms of this condition include:  Bleeding from the rectum.  Small amounts of blood seen on your stool, on toilet paper, or in the toilet after a bowel movement.  Painful bowel movements.  Itching or irritation around the anus. DIAGNOSIS A health care provider may diagnose this condition by closely examining the anal area. An anal fissure can usually be seen with careful inspection. In some cases, a rectal exam may be performed, or a short tube (anoscope) may be used to examine the anal canal. TREATMENT Treatment for this condition may include:  Taking steps to avoid constipation. This may include making changes to your diet, such as increasing your intake of fiber or fluid.  Taking fiber supplements. These supplements can soften your stool to help make bowel movements easier. Your health care provider may also prescribe a stool softener if your stool is often hard.  Taking sitz baths. This may help to heal the tear.  Using medicated creams or ointments. These may be prescribed to lessen discomfort. HOME CARE INSTRUCTIONS Eating and Drinking  Avoid foods that may be constipating, such as bananas and dairy products.  Drink enough fluid to keep your urine clear or  pale yellow.  Maintain a diet that is high in fruits, whole grains, and vegetables. General Instructions  Keep the anal area as clean and dry as possible.  Take sitz baths as told by your health care provider. Do not use soap in the sitz baths.  Take over-the-counter and prescription medicines only as told by your health care provider.  Use creams or ointments only as told by your health care provider.  Keep all follow-up visits as told by your health care provider. This is important. SEEK MEDICAL CARE IF:  You have more bleeding.  You have a fever.  You have diarrhea that is mixed with blood.  You continue to have pain.  Your problem is getting worse rather than better.   This information is not intended to replace advice given to you by your health care provider. Make sure you discuss any questions you have with your health care provider.   Document Released: 12/03/2005 Document Revised: 08/24/2015 Document Reviewed: 02/28/2015 Elsevier Interactive Patient Education [employer]vier Inc.

## 2016-05-24 NOTE — Assessment & Plan Note (Signed)
Patient declined rectal exam today but history is consistent. Rx for diltiazem gel TID for 1 month.

## 2016-05-24 NOTE — Progress Notes (Signed)
   Subjective:    Patient ID: Catherine Freeman, female    DOB: [birth year], 25 y.o.   MRN: [id number]  HPI The patient is a 25 YO female coming in for follow up of her GERD as well as for new problem of rectal pain with defecation. She is having severe pain which makes her not want to go. Started several weeks ago and has been consistent. No bleeding rectally. Started when she was having some increase in frequency of bowels. Now is more stable with 1-2 bowel movements per day. Not constipated. GERD symptoms are adequately managed. Sometimes certain foods will still bother her stomach.   Review of Systems  Constitutional: Negative for fever, activity change, appetite change, fatigue and unexpected weight change.  Respiratory: Negative for cough, chest tightness, shortness of breath and wheezing.   Cardiovascular: Negative for chest pain, palpitations and leg swelling.  Gastrointestinal: Positive for rectal pain. Negative for nausea, vomiting, abdominal pain, diarrhea, constipation, blood in stool and abdominal distention.  Musculoskeletal: Negative.   Skin: Negative.   Psychiatric/Behavioral: Negative.       Objective:   Physical Exam  Constitutional: She is oriented to person, place, and time. She appears well-developed and well-nourished.  HENT:  Head: Normocephalic and atraumatic.  Eyes: EOM are normal.  Neck: Normal range of motion.  Cardiovascular: Normal rate and regular rhythm.   Pulmonary/Chest: Effort normal and breath sounds normal. No respiratory distress. She has no wheezes. She has no rales.  Abdominal: Soft. Bowel sounds are normal. She exhibits no distension. There is no tenderness.  Declined rectal exam  Neurological: She is alert and oriented to person, place, and time.  Skin: Skin is warm and dry.   Filed Vitals:   05/24/16 1334  BP: 112/66  Pulse: 68  Temp: 98.6 F (37 C)  TempSrc: Oral  Resp: 14  Height: 5\' 3"  (1.6 m)  Weight: 166 lb 6.4 oz (75.479 kg)  SpO2: 96%        Assessment & Plan:

## 2016-06-01 ENCOUNTER — Encounter: Payer: Self-pay | Admitting: Internal Medicine

## 2016-06-01 ENCOUNTER — Ambulatory Visit (INDEPENDENT_AMBULATORY_CARE_PROVIDER_SITE_OTHER): Payer: 59 | Admitting: Family

## 2016-06-01 VITALS — BP 110/70 | HR 76 | Temp 98.7°F | Ht 63.0 in | Wt 164.2 lb

## 2016-06-01 DIAGNOSIS — R42 Dizziness and giddiness: Secondary | ICD-10-CM

## 2016-06-01 DIAGNOSIS — R110 Nausea: Secondary | ICD-10-CM | POA: Insufficient documentation

## 2016-06-01 MED ORDER — MECLIZINE HCL 25 MG PO TABS: 25.0000 mg | ORAL_TABLET | Freq: Three times a day (TID) | ORAL | Status: DC | PRN

## 2016-06-01 MED ORDER — ONDANSETRON 4 MG PO TBDP: 4.0000 mg | ORAL_TABLET | Freq: Three times a day (TID) | ORAL | Status: DC | PRN

## 2016-06-01 NOTE — Progress Notes (Signed)
Pre visit review using our clinic review tool, if applicable. No additional management support is needed unless otherwise documented below in the visit note. 

## 2016-06-01 NOTE — Patient Instructions (Signed)
Thank you for choosing [facility].  Summary/Instructions:  Please hydrate well with nonalcoholic/noncaffeineated beverages.  Change positions slowly.  Meclizine as needed for dizziness  Zofran as needed for nausea.  Your prescription(s) have been submitted to your pharmacy or been printed and provided for you. Please take as directed and contact our office if you believe you are having problem(s) with the medication(s) or have any questions.   If your symptoms worsen or fail to improve, please contact our office for further instruction, or in case of emergency go directly to the emergency room at the closest medical facility.    Dizziness Dizziness is a common problem. It is a feeling of unsteadiness or light-headedness. You may feel like you are about to faint. Dizziness can lead to injury if you stumble or fall. Anyone can become dizzy, but dizziness is more common in older adults. This condition can be caused by a number of things, including medicines, dehydration, or illness. HOME CARE INSTRUCTIONS Taking these steps may help with your condition: Eating and Drinking  Drink enough fluid to keep your urine clear or pale yellow. This helps to keep you from becoming dehydrated. Try to drink more clear fluids, such as water.  Do not drink alcohol.  Limit your caffeine intake if directed by your health care provider.  Limit your salt intake if directed by your health care provider. Activity  Avoid making quick movements.  Rise slowly from chairs and steady yourself until you feel okay.  In the morning, first sit up on the side of the bed. When you feel okay, stand slowly while you hold onto something until you know that your balance is fine.  Move your legs often if you need to stand in one place for a long time. Tighten and relax your muscles in your legs while you are standing.  Do not drive or operate heavy machinery if you feel dizzy.  Avoid bending down if you  feel dizzy. Place items in your home so that they are easy for you to reach without leaning over. Lifestyle  Do not use any tobacco products, including cigarettes, chewing tobacco, or electronic cigarettes. If you need help quitting, ask your health care provider.  Try to reduce your stress level, such as with yoga or meditation. Talk with your health care provider if you need help. General Instructions  Watch your dizziness for any changes.  Take medicines only as directed by your health care provider. Talk with your health care provider if you think that your dizziness is caused by a medicine that you are taking.  Tell a friend or a family member that you are feeling dizzy. If he or she notices any changes in your behavior, have this person call your health care provider.  Keep all follow-up visits as directed by your health care provider. This is important. SEEK MEDICAL CARE IF:  Your dizziness does not go away.  Your dizziness or light-headedness gets worse.  You feel nauseous.  You have reduced hearing.  You have new symptoms.  You are unsteady on your feet or you feel like the room is spinning. SEEK IMMEDIATE MEDICAL CARE IF:  You vomit or have diarrhea and are unable to eat or drink anything.  You have problems talking, walking, swallowing, or using your arms, hands, or legs.  You feel generally weak.  You are not thinking clearly or you have trouble forming sentences. It may take a friend or family member to notice this.  You have  chest pain, abdominal pain, shortness of breath, or sweating.  Your vision changes.  You notice any bleeding.  You have a headache.  You have neck pain or a stiff neck.  You have a fever.   This information is not intended to replace advice given to you by your health care provider. Make sure you discuss any questions you have with your health care provider.   Document Released: 05/29/2001 Document Revised: 04/19/2015 Document  Reviewed: 11/29/2014 Elsevier Interactive Patient Education [employer]

## 2016-06-01 NOTE — Progress Notes (Signed)
Subjective:    Patient ID: Catherine Freeman, female    DOB: 05/30/1991, 25 y.o.   MRN: [id number]  Chief Complaint  Patient presents with  . Dizziness    Pt states that dizziness and nausea started about 6 pm yesterday evening. Pt statest that she also has had more frequent BM's since yesterday evening as well. Pt is not awar of a dx for IBS. Dizziness occurs when the pt moves around, whether it is walking sitting up, sitting down. Pt states that the nausea has been almost constant since the start of the dizziness. Pt states that she is not having any otalgia, ear pressure, fullness or tenderness.     HPI:  Catherine Spiesekiera Kusek is a 25 y.o. female who  has a past medical history of Allergy and GERD (gastroesophageal reflux disease). and presents today for an acute office visit.   This is a new problem. Associated symptom of nausea and dizziness have been going on for about 18 hours. Symptoms of dizziness generally occur with movements and changes of position including walking, sitting up and sitting down. The nausea is almost constant since the dizziness started. Dizziness is described as the room spinning. No fevers or vomiting. She was recently started on diltizem cream for a potential anal fissure which was about 2 weeks ago. Indicates that she has been drinking a lot of water. Does express some abdominal pain that is described as burning. Has had a decreased appetite. Also endorses having some additional bowel movements.    Allergies  Allergen Reactions  . Dust Mite Extract Anaphylaxis, Hives and Other (See Comments)    Sneezing      Current Outpatient Prescriptions on File Prior to Visit  Medication Sig Dispense Refill  . diltiazem 2 % GEL Apply 1 application topically 3 (three) times daily. 30 g 1  . levocetirizine (XYZAL) 5 MG tablet Take 5 mg by mouth at bedtime.     . norethindrone-ethinyl estradiol (JUNEL FE,GILDESS FE,LOESTRIN FE) 1-20 MG-MCG tablet Take 1 tablet by mouth daily.    .  pantoprazole (PROTONIX) 20 MG tablet Take 1 tablet (20 mg total) by mouth daily. 30 tablet 6  . ranitidine (ZANTAC) 150 MG tablet TAKE 1 TABLET BY MOUTH 2 TIMES DAILY 60 tablet 5   No current facility-administered medications on file prior to visit.    Review of Systems  Constitutional: Negative for fever and chills.  HENT: Negative for congestion, ear pain and sinus pressure.   Neurological: Positive for dizziness.      Objective:    BP 110/70 mmHg  Pulse 76  Temp(Src) 98.7 F (37.1 C) (Oral)  Ht 5\' 3"  (1.6 m)  Wt 164 lb 4 oz (74.503 kg)  BMI 29.10 kg/m2  SpO2 97%  LMP 05/07/2016 Nursing note and vital signs reviewed.  Physical Exam  Constitutional: She is oriented to person, place, and time. She appears well-developed and well-nourished. No distress.  HENT:  Right Ear: Hearing, tympanic membrane, external ear and ear canal normal.  Left Ear: Hearing, tympanic membrane, external ear and ear canal normal.  Mouth/Throat: Uvula is midline, oropharynx is clear and moist and mucous membranes are normal.  Cardiovascular: Normal rate, regular rhythm, normal heart sounds and intact distal pulses.   Pulmonary/Chest: Effort normal and breath sounds normal.  Neurological: She is alert and oriented to person, place, and time.  Skin: Skin is warm and dry.  Psychiatric: She has a normal mood and affect. Her behavior is normal. Judgment and thought content  normal.       Assessment & Plan:   Problem List Items Addressed This Visit      Other   Dizziness - Primary    Dizziness appears to be more lightheaded related as changing positions from sitting to standing and lying to sitting resultant symptoms. Dix-Hallpike is negative. Encouraged hydration with nonalcoholic/non-caffeinated beverages. Start meclizine with questionable vertigo given description of spinning. Cannot rule out possible viral infection. Follow-up if symptoms worsen or do not improve.      Relevant Medications    meclizine (ANTIVERT) 25 MG tablet   Nausea    Symptoms of nausea most likely associated with dizziness/lightheadedness. Start ondansetron. Abdominal exam is benign. Question possible viral infection. Follow-up if symptoms worsen or do not improve.      Relevant Medications   ondansetron (ZOFRAN-ODT) 4 MG disintegrating tablet       I am having Ms. Woodbury start on meclizine and ondansetron. I am also having her maintain her norethindrone-ethinyl estradiol, levocetirizine, pantoprazole, ranitidine, and diltiazem.   Meds ordered this encounter  Medications  . meclizine (ANTIVERT) 25 MG tablet    Sig: Take 1 tablet (25 mg total) by mouth 3 (three) times daily as needed for dizziness.    Dispense:  30 tablet    Refill:  0    Order Specific Question:  Supervising Provider    Answer:  Hillard Danker A [[id number]]  . ondansetron (ZOFRAN-ODT) 4 MG disintegrating tablet    Sig: Take 1 tablet (4 mg total) by mouth every 8 (eight) hours as needed for nausea or vomiting.    Dispense:  20 tablet    Refill:  0    Order Specific Question:  Supervising Provider    Answer:  Hillard Danker A [[id number]]     Follow-up: Return if symptoms worsen or fail to improve.  Jeanine Luz, FNP

## 2016-06-01 NOTE — Assessment & Plan Note (Addendum)
Dizziness appears to be more lightheaded related as changing positions from sitting to standing and lying to sitting resultant symptoms. Dix-Hallpike is negative. Encouraged hydration with nonalcoholic/non-caffeinated beverages. Start meclizine with questionable vertigo given description of spinning. Cannot rule out possible viral infection. Follow-up if symptoms worsen or do not improve.

## 2016-06-01 NOTE — Assessment & Plan Note (Signed)
Symptoms of nausea most likely associated with dizziness/lightheadedness. Start ondansetron. Abdominal exam is benign. Question possible viral infection. Follow-up if symptoms worsen or do not improve.

## 2016-06-25 ENCOUNTER — Ambulatory Visit (INDEPENDENT_AMBULATORY_CARE_PROVIDER_SITE_OTHER): Payer: 59 | Admitting: Internal Medicine

## 2016-06-25 ENCOUNTER — Encounter: Payer: Self-pay | Admitting: Internal Medicine

## 2016-06-25 VITALS — BP 120/60 | HR 73 | Temp 99.3°F | Resp 14 | Ht 63.0 in | Wt 163.0 lb

## 2016-06-25 DIAGNOSIS — K602 Anal fissure, unspecified: Secondary | ICD-10-CM | POA: Diagnosis not present

## 2016-06-25 DIAGNOSIS — K219 Gastro-esophageal reflux disease without esophagitis: Secondary | ICD-10-CM

## 2016-06-25 DIAGNOSIS — R197 Diarrhea, unspecified: Secondary | ICD-10-CM | POA: Diagnosis not present

## 2016-06-25 NOTE — Patient Instructions (Signed)
We have given you the work note for today and will fax today's note if you get us the fax number to update the FMLA.   We will get you in with the GI doctor to get checked out.  It is okay to try taking imodium to slow down the stomach and to help give you better control over the bowels. You can take up to 3-4 per day if needed. It you are getting constipated you are taking too much.

## 2016-06-25 NOTE — Progress Notes (Signed)
Pre visit review using our clinic review tool, if applicable. No additional management support is needed unless otherwise documented below in the visit note. 

## 2016-06-26 DIAGNOSIS — R197 Diarrhea, unspecified: Secondary | ICD-10-CM | POA: Insufficient documentation

## 2016-06-26 NOTE — Assessment & Plan Note (Signed)
Healed with appropriate treatment.

## 2016-06-26 NOTE — Progress Notes (Signed)
   Subjective:    Patient ID: Catherine Freeman, female    DOB: 1991-11-19, 25 y.o.   MRN: [id number]  HPI The patient is a 25 YO female coming in for diarrhea. She is moving her bowels a lot more recently including already 7-8 times today. No blood in the diarrhea and somewhat loose but not all water. Her anal fissure healed with treatment since our last visit. She denies pain with movements. She does get flares of pain in her stomach for which she is taking protonix and zantac which work sometimes. No dietary triggers that she can identify at this time. 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   Review of Systems  Constitutional: Negative for fever, activity change, appetite change, fatigue and unexpected weight change.  Respiratory: Negative for cough, chest tightness, shortness of breath and wheezing.   Cardiovascular: Negative for chest pain, palpitations and leg swelling.  Gastrointestinal: Positive for nausea, abdominal pain and diarrhea. Negative for vomiting, constipation, blood in stool, abdominal distention, anal bleeding and rectal pain.  Musculoskeletal: Negative.   Skin: Negative.   Neurological: Negative.   Psychiatric/Behavioral: Negative.       Objective:   Physical Exam  Constitutional: She is oriented to person, place, and time. She appears well-developed and well-nourished.  HENT:  Head: Normocephalic and atraumatic.  Eyes: EOM are normal.  Neck: Normal range of motion.  Cardiovascular: Normal rate and regular rhythm.   Pulmonary/Chest: Effort normal and breath sounds normal. No respiratory distress. She has no wheezes. She has no rales.  Abdominal: Soft. Bowel sounds are normal. She exhibits no distension and no mass. There is no tenderness. There is no rebound and no guarding.  Neurological: She is alert and oriented to person, place, and time.    Skin: Skin is warm and dry.   Filed Vitals:   06/25/16 1627  BP: 120/60  Pulse: 73  Temp: 99.3 F (37.4 C)  TempSrc: Oral  Resp: 14  Height: 5\' 3"  (1.6 m)  Weight: 163 lb (73.936 kg)  SpO2: 98%      Assessment & Plan:

## 2016-06-26 NOTE — Assessment & Plan Note (Signed)
Taking protonix and zantac and only sometimes gets relief and the pain can cause her to miss work. No consistent food triggers.

## 2016-06-26 NOTE — Assessment & Plan Note (Signed)
Needs evaluation with GI due to sudden change in bowels. She has not tried anything so she will try imodium to slow down her bowels. Still having stomach pain from her GERD even with maximum treatment. Refer to GI. Until evaluation is complete needs increase in FMLA days to 3 per month due to the change in her health condition.

## 2016-06-27 ENCOUNTER — Telehealth: Payer: Self-pay | Admitting: Internal Medicine

## 2016-06-27 NOTE — Telephone Encounter (Signed)
Patient called to follow up on FMLA given to dr crawford on Monday. She states that it needed to be faxed in to [employer]. i do not see any notes or imaged paperwork about this. Please follow up and make sure that it is faxed and reach out to patient.   She provided a fax # of [phone]

## 2016-06-27 NOTE — Telephone Encounter (Signed)
Left message on voice mail informing patient that I faxed the paperwork yesterday.

## 2016-06-28 ENCOUNTER — Other Ambulatory Visit: Payer: Self-pay | Admitting: *Deleted

## 2016-06-28 MED ORDER — RANITIDINE HCL 150 MG PO TABS: 150.0000 mg | ORAL_TABLET | Freq: Two times a day (BID) | ORAL | Status: DC

## 2016-06-29 NOTE — Telephone Encounter (Signed)
Pt called stating that paperwork was received by [employer] but medical certification stating frequency of FMLA leave was missing.  Pt is requesting this information be faxed to the same fax number provided below and a call back to confirm once it has been done

## 2016-07-03 ENCOUNTER — Encounter: Payer: Self-pay | Admitting: Internal Medicine

## 2016-07-03 NOTE — Telephone Encounter (Signed)
Patient called to check status of paperwork. Advised no notes since Friday and no ETA at this point. Please follow up

## 2016-07-03 NOTE — Telephone Encounter (Signed)
Left message for patient to call back about forms. I faxed all the forms that I had. I want to clarify what else needs to be faxed.

## 2016-07-04 NOTE — Telephone Encounter (Signed)
Patient is faxing over necessary paperwork for Dr. Okey Duprerawford to fill out.

## 2016-07-06 ENCOUNTER — Ambulatory Visit: Payer: Self-pay | Admitting: Internal Medicine

## 2016-07-06 ENCOUNTER — Encounter: Payer: Self-pay | Admitting: Internal Medicine

## 2016-09-11 ENCOUNTER — Ambulatory Visit (INDEPENDENT_AMBULATORY_CARE_PROVIDER_SITE_OTHER): Payer: 59 | Admitting: Internal Medicine

## 2016-09-11 ENCOUNTER — Other Ambulatory Visit (INDEPENDENT_AMBULATORY_CARE_PROVIDER_SITE_OTHER): Payer: 59

## 2016-09-11 ENCOUNTER — Encounter: Payer: Self-pay | Admitting: Internal Medicine

## 2016-09-11 VITALS — BP 110/72 | HR 74 | Ht 63.0 in | Wt 162.0 lb

## 2016-09-11 DIAGNOSIS — R197 Diarrhea, unspecified: Secondary | ICD-10-CM | POA: Diagnosis not present

## 2016-09-11 DIAGNOSIS — R1013 Epigastric pain: Secondary | ICD-10-CM | POA: Diagnosis not present

## 2016-09-11 DIAGNOSIS — G8929 Other chronic pain: Secondary | ICD-10-CM

## 2016-09-11 LAB — COMPREHENSIVE METABOLIC PANEL
ALK PHOS: 82 U/L (ref 39–117)
ALT: 12 U/L (ref 0–35)
AST: 13 U/L (ref 0–37)
Albumin: 4 g/dL (ref 3.5–5.2)
BUN: 6 mg/dL (ref 6–23)
CALCIUM: 9 mg/dL (ref 8.4–10.5)
CO2: 29 meq/L (ref 19–32)
Chloride: 104 mEq/L (ref 96–112)
Creatinine, Ser: 0.6 mg/dL (ref 0.40–1.20)
GFR: 128.11 mL/min (ref >60.00)
GLUCOSE: 94 mg/dL (ref 70–99)
POTASSIUM: 4 meq/L (ref 3.5–5.1)
Sodium: 139 mEq/L (ref 135–145)
TOTAL PROTEIN: 7.4 g/dL (ref 6.0–8.3)
Total Bilirubin: 0.4 mg/dL (ref 0.2–1.2)

## 2016-09-11 LAB — CBC WITH DIFFERENTIAL/PLATELET
BASOS PCT: 0.5 % (ref 0.0–3.0)
Basophils Absolute: 0 10*3/uL (ref 0.0–0.1)
Eosinophils Absolute: 0.2 10*3/uL (ref 0.0–0.7)
Eosinophils Relative: 2.9 % (ref 0.0–5.0)
HEMATOCRIT: 41.3 % (ref 36.0–46.0)
Hemoglobin: 14 g/dL (ref 12.0–15.0)
LYMPHS PCT: 23.9 % (ref 12.0–46.0)
Lymphs Abs: 2 10*3/uL (ref 0.7–4.0)
MCHC: 33.9 g/dL (ref 30.0–36.0)
MCV: 87.5 fl (ref 78.0–100.0)
MONOS PCT: 6.2 % (ref 3.0–12.0)
Monocytes Absolute: 0.5 10*3/uL (ref 0.1–1.0)
Neutro Abs: 5.6 10*3/uL (ref 1.4–7.7)
Neutrophils Relative %: 66.5 % (ref 43.0–77.0)
PLATELETS: 274 10*3/uL (ref 150.0–400.0)
RBC: 4.72 Mil/uL (ref 3.87–5.11)
RDW: 13.2 % (ref 11.5–15.5)
WBC: 8.5 10*3/uL (ref 4.0–10.5)

## 2016-09-11 LAB — SEDIMENTATION RATE: Sed Rate: 14 mm/hr (ref 0–20)

## 2016-09-11 LAB — IGA: IgA: 265 mg/dL (ref 68–378)

## 2016-09-11 LAB — TSH: TSH: 0.9 u[IU]/mL (ref 0.35–4.50)

## 2016-09-11 NOTE — Patient Instructions (Addendum)
Your physician has requested that you go to the basement for lab work before leaving today.  Stop chewing sugar free gum.  Begin a high fiber diet  Use 1 tablespoon of Metamucil daily  Please follow up on 10/25/2016 at 9:00am

## 2016-09-11 NOTE — Progress Notes (Signed)
HISTORY OF PRESENT ILLNESS:  Catherine Freeman is a 25 y.o. female with no significant past medical history who is referred today by Dr. Hillard DankerElizabeth Crawford regarding problems with chronic intermittent frequent stools and epigastric/substernal burning discomfort. The patient reports a two-year history of increased frequency of bowel movements. Worse over the past 5 months. Occurs approximately 5 days each month lasting just one day. When the problem is at its worst, she reports 7-8 bowel movements. Initially normal then transforming to soft. No true watery stools, mucus, or bleeding. No generalized abdominal pain or lower abdominal pain. The problem may be spread out over the course of the day and occasionally exacerbated by meals. She does chew sugar-free gum regular. No other sugar free food items. No supplements. She does notice that the epigastric and substernal burning discomfort tends to occur around the time of bowel irregularity. She has been on PPI and H2 receptor antagonist therapy without obvious improvement. No dysphagia. No weight loss. She denies nocturnal symptoms. Stress does not seem to be a factor. She was seen in the emergency room February with negative abdominal ultrasound.  REVIEW OF SYSTEMS:  All non-GI ROS negative except for allergies, menstrual cramps, sleeping problems  Past Medical History:  Diagnosis Date  . Allergy   . GERD (gastroesophageal reflux disease)   . IBS (irritable bowel syndrome)     History reviewed. No pertinent surgical history.  Social History Remi Haggardekiera Wrage  reports that she has never smoked. She has never used smokeless tobacco. She reports that she drinks alcohol. She reports that she does not use drugs.  family history includes Diabetes in her father and sister; Prostate cancer in her paternal grandfather.  Allergies  Allergen Reactions  . Dust Mite Extract Anaphylaxis, Hives and Other (See Comments)    Sneezing        PHYSICAL EXAMINATION: Vital  signs: BP 110/72   Pulse 74   Ht 5\' 3"  (1.6 m)   Wt 162 lb (73.5 kg)   LMP 09/02/2016 (Approximate)   BMI 28.70 kg/m   Constitutional: generally well-appearing, no acute distress Psychiatric: alert and oriented x3, cooperative Eyes: extraocular movements intact, anicteric, conjunctiva pink Mouth: oral pharynx moist, no lesions Neck: suppleWithout thyromegaly Lymph: no lymphadenopathy Cardiovascular: heart regular rate and rhythm, no murmur Lungs: clear to auscultation bilaterally Abdomen: soft, nontender, nondistended, no obvious ascites, no peritoneal signs, normal bowel sounds, no organomegaly Rectal:Omitted Extremities: no clubbing cyanosis or lower extremity edema bilaterally Skin: no lesions on visible extremities Neuro: No focal deficits. Cranial nerves intact  ASSESSMENT:  #1. Chronic intermittent problems with increased frequency of bowel movements with tendency toward looseness. Rule out osmotic effect of sugar-free gum, celiac disease, IBS #2. Intermittent problems with substernal/epigastric burning discomfort not responsive to PPI. No alarm features. Negative abdominal ultrasound. Etiology not clear  PLAN:  #1. General laboratories today including CBC, comprehensive metabolic panel, erythrocyte sedimentation rate, and celiac screening #2. Stop sugar-free gum #3. Metamucil 2 tablespoons daily #4. High-fiber diet. Provided #5. Routine office follow-up 6 weeks. Could consider endoscopic evaluations if symptoms persist despite above measures  A copy of this consultation note has been sent to Dr. Okey Duprerawford

## 2016-09-12 LAB — TISSUE TRANSGLUTAMINASE, IGA: Tissue Transglutaminase Ab, IgA: 1 U/mL (ref <4)

## 2016-09-19 ENCOUNTER — Other Ambulatory Visit: Payer: Self-pay | Admitting: Internal Medicine

## 2016-09-27 ENCOUNTER — Ambulatory Visit: Payer: Self-pay | Admitting: Internal Medicine

## 2016-10-02 ENCOUNTER — Encounter: Payer: Self-pay | Admitting: Internal Medicine

## 2016-10-02 ENCOUNTER — Ambulatory Visit (INDEPENDENT_AMBULATORY_CARE_PROVIDER_SITE_OTHER): Payer: 59 | Admitting: Internal Medicine

## 2016-10-02 DIAGNOSIS — K219 Gastro-esophageal reflux disease without esophagitis: Secondary | ICD-10-CM | POA: Diagnosis not present

## 2016-10-02 DIAGNOSIS — R197 Diarrhea, unspecified: Secondary | ICD-10-CM

## 2016-10-02 NOTE — Progress Notes (Signed)
Pre visit review using our clinic review tool, if applicable. No additional management support is needed unless otherwise documented below in the visit note. 

## 2016-10-02 NOTE — Progress Notes (Signed)
   Subjective:    Patient ID: Catherine Freeman, female    DOB: 02/04/1991, 25 y.o.   MRN: [id number]  HPI The patient is a 25 YO female coming in for FMLA papers filled out. She is still needing 3 days per month for her GI issues. She has seen a GI specialist and they tried to take her off the medicine we started which is helping and have her try metamucil (which we tried unsuccessfully about 1 year ago). She has follow up scheduled with them next month which she will keep. Overall her condition is about the same as before. She is still having good and bad days with her stomach. Some diarrhea symptoms still and some GERD although better controlled with dual medication now.   Review of Systems  Constitutional: Positive for appetite change. Negative for activity change, chills, diaphoresis, fatigue and unexpected weight change.  Respiratory: Negative.   Cardiovascular: Negative.   Gastrointestinal: Positive for abdominal distention, abdominal pain and diarrhea. Negative for anal bleeding, blood in stool, constipation, nausea and vomiting.  Musculoskeletal: Negative.   Neurological: Negative.       Objective:   Physical Exam  Constitutional: She is oriented to person, place, and time. She appears well-developed and well-nourished.  HENT:  Head: Normocephalic and atraumatic.  Eyes: EOM are normal.  Neck: Normal range of motion.  Cardiovascular: Normal rate and regular rhythm.   Pulmonary/Chest: Effort normal and breath sounds normal.  Abdominal: Soft. Bowel sounds are normal. She exhibits no distension. There is no tenderness. There is no rebound and no guarding.  No tenderness on exam today.  Musculoskeletal: She exhibits no edema.  Neurological: She is alert and oriented to person, place, and time. Coordination normal.  Skin: Skin is warm and dry.   Vitals:   10/02/16 1539  BP: 110/60  Pulse: 69  Resp: 12  Temp: 98.4 F (36.9 C)  TempSrc: Oral  SpO2: 98%  Weight: 161 lb (73 kg)    Height: 5\' 3"  (1.6 m)      Assessment & Plan:

## 2016-10-02 NOTE — Patient Instructions (Signed)
We have filled out the FMLA forms for you today and faxed them as well as given you a copy.

## 2016-10-03 NOTE — Assessment & Plan Note (Signed)
Encouraged her to return to GI to give them a chance to help but if no new suggestions she would like second opinion. Filled out FMLA for 3 days per month missed work which is a significant amount of time.

## 2016-10-03 NOTE — Assessment & Plan Note (Signed)
Doing better with the protonix and the zantac and was encouraged to stop by GI which she has not done due to still having symptoms on this treatment.

## 2016-10-25 ENCOUNTER — Ambulatory Visit: Payer: 59 | Admitting: Internal Medicine

## 2016-10-26 ENCOUNTER — Ambulatory Visit (INDEPENDENT_AMBULATORY_CARE_PROVIDER_SITE_OTHER): Payer: 59 | Admitting: Internal Medicine

## 2016-10-26 ENCOUNTER — Encounter: Payer: Self-pay | Admitting: Internal Medicine

## 2016-10-26 DIAGNOSIS — Z0000 Encounter for general adult medical examination without abnormal findings: Secondary | ICD-10-CM

## 2016-10-26 DIAGNOSIS — R197 Diarrhea, unspecified: Secondary | ICD-10-CM

## 2016-10-26 NOTE — Patient Instructions (Signed)
We do not need to check labs today.   Keep up the good eating for your stomach. The portion size for meats in general is 4 oz or the size of a deck of cards.   Health Maintenance, Female Adopting a healthy lifestyle and getting preventive care can go a long way to promote health and wellness. Talk with your health care provider about what schedule of regular examinations is right for you. This is a good chance for you to check in with your provider about disease prevention and staying healthy. In between checkups, there are plenty of things you can do on your own. Experts have done a lot of research about which lifestyle changes and preventive measures are most likely to keep you healthy. Ask your health care provider for more information. WEIGHT AND DIET  Eat a healthy diet  Be sure to include plenty of vegetables, fruits, low-fat dairy products, and lean protein.  Do not eat a lot of foods high in solid fats, added sugars, or salt.  Get regular exercise. This is one of the most important things you can do for your health.  Most adults should exercise for at least 150 minutes each week. The exercise should increase your heart rate and make you sweat (moderate-intensity exercise).  Most adults should also do strengthening exercises at least twice a week. This is in addition to the moderate-intensity exercise.  Maintain a healthy weight  Body mass index (BMI) is a measurement that can be used to identify possible weight problems. It estimates body fat based on height and weight. Your health care provider can help determine your BMI and help you achieve or maintain a healthy weight.  For females 43 years of age and older:   A BMI below 18.5 is considered underweight.  A BMI of 18.5 to 24.9 is normal.  A BMI of 25 to 29.9 is considered overweight.  A BMI of 30 and above is considered obese.  Watch levels of cholesterol and blood lipids  You should start having your blood tested for  lipids and cholesterol at 26 years of age, then have this test every 5 years.  You may need to have your cholesterol levels checked more often if:  Your lipid or cholesterol levels are high.  You are older than 27 years of age.  You are at high risk for heart disease.  CANCER SCREENING   Lung Cancer  Lung cancer screening is recommended for adults 66-87 years old who are at high risk for lung cancer because of a history of smoking.  A yearly low-dose CT scan of the lungs is recommended for people who:  Currently smoke.  Have quit within the past 15 years.  Have at least a 30-pack-year history of smoking. A pack year is smoking an average of one pack of cigarettes a day for 1 year.  Yearly screening should continue until it has been 15 years since you quit.  Yearly screening should stop if you develop a health problem that would prevent you from having lung cancer treatment.  Breast Cancer  Practice breast self-awareness. This means understanding how your breasts normally appear and feel.  It also means doing regular breast self-exams. Let your health care provider know about any changes, no matter how small.  If you are in your 20s or 30s, you should have a clinical breast exam (CBE) by a health care provider every 1-3 years as part of a regular health exam.  If you are 40  or older, have a CBE every year. Also consider having a breast X-ray (mammogram) every year.  If you have a family history of breast cancer, talk to your health care provider about genetic screening.  If you are at high risk for breast cancer, talk to your health care provider about having an MRI and a mammogram every year.  Breast cancer gene (BRCA) assessment is recommended for women who have family members with BRCA-related cancers. BRCA-related cancers include:  Breast.  Ovarian.  Tubal.  Peritoneal cancers.  Results of the assessment will determine the need for genetic counseling and BRCA1  and BRCA2 testing. Cervical Cancer Your health care provider may recommend that you be screened regularly for cancer of the pelvic organs (ovaries, uterus, and vagina). This screening involves a pelvic examination, including checking for microscopic changes to the surface of your cervix (Pap test). You may be encouraged to have this screening done every 3 years, beginning at age 48.  For women ages 65-65, health care providers may recommend pelvic exams and Pap testing every 3 years, or they may recommend the Pap and pelvic exam, combined with testing for human papilloma virus (HPV), every 5 years. Some types of HPV increase your risk of cervical cancer. Testing for HPV may also be done on women of any age with unclear Pap test results.  Other health care providers may not recommend any screening for nonpregnant women who are considered low risk for pelvic cancer and who do not have symptoms. Ask your health care provider if a screening pelvic exam is right for you.  If you have had past treatment for cervical cancer or a condition that could lead to cancer, you need Pap tests and screening for cancer for at least 20 years after your treatment. If Pap tests have been discontinued, your risk factors (such as having a new sexual partner) need to be reassessed to determine if screening should resume. Some women have medical problems that increase the chance of getting cervical cancer. In these cases, your health care provider may recommend more frequent screening and Pap tests. Colorectal Cancer  This type of cancer can be detected and often prevented.  Routine colorectal cancer screening usually begins at 26 years of age and continues through 26 years of age.  Your health care provider may recommend screening at an earlier age if you have risk factors for colon cancer.  Your health care provider may also recommend using home test kits to check for hidden blood in the stool.  A small camera at the  end of a tube can be used to examine your colon directly (sigmoidoscopy or colonoscopy). This is done to check for the earliest forms of colorectal cancer.  Routine screening usually begins at age 90.  Direct examination of the colon should be repeated every 5-10 years through 26 years of age. However, you may need to be screened more often if early forms of precancerous polyps or small growths are found. Skin Cancer  Check your skin from head to toe regularly.  Tell your health care provider about any new moles or changes in moles, especially if there is a change in a mole's shape or color.  Also tell your health care provider if you have a mole that is larger than the size of a pencil eraser.  Always use sunscreen. Apply sunscreen liberally and repeatedly throughout the day.  Protect yourself by wearing long sleeves, pants, a wide-brimmed hat, and sunglasses whenever you are outside. HEART DISEASE,  DIABETES, AND HIGH BLOOD PRESSURE   High blood pressure causes heart disease and increases the risk of stroke. High blood pressure is more likely to develop in:  People who have blood pressure in the high end of the normal range (130-139/85-89 mm Hg).  People who are overweight or obese.  People who are African American.  If you are 18-39 years of age, have your blood pressure checked every 3-5 years. If you are 40 years of age or older, have your blood pressure checked every year. You should have your blood pressure measured twice--once when you are at a hospital or clinic, and once when you are not at a hospital or clinic. Record the average of the two measurements. To check your blood pressure when you are not at a hospital or clinic, you can use:  An automated blood pressure machine at a pharmacy.  A home blood pressure monitor.  If you are between 55 years and 79 years old, ask your health care provider if you should take aspirin to prevent strokes.  Have regular diabetes  screenings. This involves taking a blood sample to check your fasting blood sugar level.  If you are at a normal weight and have a low risk for diabetes, have this test once every three years after 26 years of age.  If you are overweight and have a high risk for diabetes, consider being tested at a younger age or more often. PREVENTING INFECTION  Hepatitis B  If you have a higher risk for hepatitis B, you should be screened for this virus. You are considered at high risk for hepatitis B if:  You were born in a country where hepatitis B is common. Ask your health care provider which countries are considered high risk.  Your parents were born in a high-risk country, and you have not been immunized against hepatitis B (hepatitis B vaccine).  You have HIV or AIDS.  You use needles to inject street drugs.  You live with someone who has hepatitis B.  You have had sex with someone who has hepatitis B.  You get hemodialysis treatment.  You take certain medicines for conditions, including cancer, organ transplantation, and autoimmune conditions. Hepatitis C  Blood testing is recommended for:  Everyone born from 1945 through 1965.  Anyone with known risk factors for hepatitis C. Sexually transmitted infections (STIs)  You should be screened for sexually transmitted infections (STIs) including gonorrhea and chlamydia if:  You are sexually active and are younger than 26 years of age.  You are older than 26 years of age and your health care provider tells you that you are at risk for this type of infection.  Your sexual activity has changed since you were last screened and you are at an increased risk for chlamydia or gonorrhea. Ask your health care provider if you are at risk.  If you do not have HIV, but are at risk, it may be recommended that you take a prescription medicine daily to prevent HIV infection. This is called pre-exposure prophylaxis (PrEP). You are considered at risk  if:  You are sexually active and do not regularly use condoms or know the HIV status of your partner(s).  You take drugs by injection.  You are sexually active with a partner who has HIV. Talk with your health care provider about whether you are at high risk of being infected with HIV. If you choose to begin PrEP, you should first be tested for HIV. You should then   be tested every 3 months for as long as you are taking PrEP.  PREGNANCY   If you are premenopausal and you may become pregnant, ask your health care provider about preconception counseling.  If you may become pregnant, take 400 to 800 micrograms (mcg) of folic acid every day.  If you want to prevent pregnancy, talk to your health care provider about birth control (contraception). OSTEOPOROSIS AND MENOPAUSE   Osteoporosis is a disease in which the bones lose minerals and strength with aging. This can result in serious bone fractures. Your risk for osteoporosis can be identified using a bone density scan.  If you are 68 years of age or older, or if you are at risk for osteoporosis and fractures, ask your health care provider if you should be screened.  Ask your health care provider whether you should take a calcium or vitamin D supplement to lower your risk for osteoporosis.  Menopause may have certain physical symptoms and risks.  Hormone replacement therapy may reduce some of these symptoms and risks. Talk to your health care provider about whether hormone replacement therapy is right for you.  HOME CARE INSTRUCTIONS   Schedule regular health, dental, and eye exams.  Stay current with your immunizations.   Do not use any tobacco products including cigarettes, chewing tobacco, or electronic cigarettes.  If you are pregnant, do not drink alcohol.  If you are breastfeeding, limit how much and how often you drink alcohol.  Limit alcohol intake to no more than 1 drink per day for nonpregnant women. One drink equals 12  ounces of beer, 5 ounces of wine, or 1 ounces of hard liquor.  Do not use street drugs.  Do not share needles.  Ask your health care provider for help if you need support or information about quitting drugs.  Tell your health care provider if you often feel depressed.  Tell your health care provider if you have ever been abused or do not feel safe at home.   This information is not intended to replace advice given to you by your health care provider. Make sure you discuss any questions you have with your health care provider.   Document Released: 06/18/2011 Document Revised: 12/24/2014 Document Reviewed: 11/04/2013 Elsevier Interactive Patient Education [employer].

## 2016-10-26 NOTE — Progress Notes (Signed)
   Subjective:    Patient ID: Catherine Freeman, female    DOB: 12/27/1990, 25 y.o.   MRN: [id number]  HPI The patient is a 25 YO female coming in for wellness.   [facility], social history reviewed and updated.   Review of Systems  Constitutional: Negative.   HENT: Negative.   Eyes: Negative.   Respiratory: Negative for cough, chest tightness and shortness of breath.   Cardiovascular: Negative for chest pain, palpitations and leg swelling.  Gastrointestinal: Negative for abdominal distention, abdominal pain, constipation, diarrhea, nausea and vomiting.  Musculoskeletal: Negative.   Skin: Negative.   Neurological: Negative.   Psychiatric/Behavioral: Negative.       Objective:   Physical Exam  Constitutional: She is oriented to person, place, and time. She appears well-developed and well-nourished.  HENT:  Head: Normocephalic and atraumatic.  Eyes: EOM are normal.  Neck: Normal range of motion.  Cardiovascular: Normal rate and regular rhythm.   Pulmonary/Chest: Effort normal and breath sounds normal. No respiratory distress. She has no wheezes. She has no rales.  Abdominal: Soft. Bowel sounds are normal. She exhibits no distension. There is no tenderness. There is no rebound.  Musculoskeletal: She exhibits no edema.  Neurological: She is alert and oriented to person, place, and time. Coordination normal.  Skin: Skin is warm and dry.  Psychiatric: She has a normal mood and affect.   Vitals:   10/26/16 0857  BP: 130/72  Pulse: 66  Resp: 16  Temp: 98.6 F (37 C)  TempSrc: Oral  SpO2: 98%  Weight: 162 lb (73.5 kg)  Height: 5\' 3"  (1.6 m)      Assessment & Plan:

## 2016-10-26 NOTE — Progress Notes (Signed)
Pre visit review using our clinic review tool, if applicable. No additional management support is needed unless otherwise documented below in the visit note. 

## 2016-10-26 NOTE — Assessment & Plan Note (Signed)
Declines flu shot, tetanus up to date. Recent labs within normal limits including cholesterol. Counseled on exercise regularly, sun safety, and dangers of distracted driving. Given screening recommendations.

## 2016-10-26 NOTE — Assessment & Plan Note (Signed)
Much better since limiting meats. Still using protonix daily and zantac also.

## 2016-11-23 ENCOUNTER — Ambulatory Visit: Payer: 59 | Admitting: Internal Medicine

## 2016-12-14 ENCOUNTER — Other Ambulatory Visit: Payer: Self-pay | Admitting: Internal Medicine

## 2016-12-24 ENCOUNTER — Encounter: Payer: Self-pay | Admitting: Internal Medicine

## 2016-12-24 ENCOUNTER — Ambulatory Visit (INDEPENDENT_AMBULATORY_CARE_PROVIDER_SITE_OTHER): Payer: 59 | Admitting: Internal Medicine

## 2016-12-24 DIAGNOSIS — F4010 Social phobia, unspecified: Secondary | ICD-10-CM

## 2016-12-24 MED ORDER — BUSPIRONE HCL 5 MG PO TABS: 5.0000 mg | ORAL_TABLET | Freq: Two times a day (BID) | ORAL | 2 refills | Status: DC | PRN

## 2016-12-24 MED ORDER — RANITIDINE HCL 150 MG PO TABS: 150.0000 mg | ORAL_TABLET | Freq: Two times a day (BID) | ORAL | 11 refills | Status: DC

## 2016-12-24 NOTE — Patient Instructions (Signed)
We have sent in the buspar that you can use as needed for the anxiety.   You do not have to take it everyday and can just use it as needed.

## 2016-12-24 NOTE — Progress Notes (Signed)
   Subjective:    Patient ID: Catherine Freeman, female    DOB: 10/14/1991, 26 y.o.   MRN: [id number]  HPI The patient is a 26 YO female coming in for more anxiety lately. She does have social anxiety and working with a Veterinary surgeoncounselor. She has made good strides with the counselors but still gets problems when people come over to her house. She was advised to come see her doctor about something prn for anxiety. She is leery about taking something daily for it but wouldn't mind having something on hand for as needed. She is not wanting something she could have problems with. No SI/HI. Able to function at work well and in most situations in her life.   Review of Systems  Constitutional: Negative.   Respiratory: Negative.   Cardiovascular: Negative.   Gastrointestinal: Negative.   Neurological: Negative.   Psychiatric/Behavioral: Negative for agitation, behavioral problems, decreased concentration, dysphoric mood, self-injury, sleep disturbance and suicidal ideas. The patient is nervous/anxious.       Objective:   Physical Exam  Constitutional: She is oriented to person, place, and time. She appears well-developed and well-nourished.  HENT:  Head: Normocephalic and atraumatic.  Cardiovascular: Normal rate and regular rhythm.   Pulmonary/Chest: Effort normal and breath sounds normal.  Abdominal: Soft.  Neurological: She is alert and oriented to person, place, and time.  Skin: Skin is warm and dry.  Psychiatric: She has a normal mood and affect. Her behavior is normal.   Vitals:   12/24/16 1429  BP: 116/64  Pulse: 70  Resp: 12  Temp: 98.2 F (36.8 C)  TempSrc: Oral  SpO2: 99%  Weight: 161 lb 12 oz (73.4 kg)  Height: 5\' 3"  (1.6 m)      Assessment & Plan:

## 2016-12-24 NOTE — Progress Notes (Signed)
Pre visit review using our clinic review tool, if applicable. No additional management support is needed unless otherwise documented below in the visit note. 

## 2016-12-24 NOTE — Assessment & Plan Note (Signed)
Rx for buspar as needed and continue with counseling. She does have good coping skills and encouraged her to continue with those.

## 2017-01-08 ENCOUNTER — Encounter: Payer: Self-pay | Admitting: Internal Medicine

## 2017-01-08 ENCOUNTER — Ambulatory Visit (INDEPENDENT_AMBULATORY_CARE_PROVIDER_SITE_OTHER): Payer: 59 | Admitting: Internal Medicine

## 2017-01-08 DIAGNOSIS — R197 Diarrhea, unspecified: Secondary | ICD-10-CM

## 2017-01-08 NOTE — Patient Instructions (Signed)
We can get the new FMLA papers sent in for you.   You can try to attach it to a mychart message and we will let you know if we get it.  Also with the GI doctor if you find one you want to go see let us know in mychart message and we will get you in.

## 2017-01-08 NOTE — Assessment & Plan Note (Signed)
She will find a new GI doctor she wants to see and then we will refer her given worsening of her symptoms. She is still using protonix and zantac and imodium as needed with reasonable success. Not able to work during flare due to work's strict bathroom time limit which she is not able to adhere to during flare. Will change FMLA to 5 episodes per month, 1 day each.

## 2017-01-08 NOTE — Progress Notes (Signed)
Pre visit review using our clinic review tool, if applicable. No additional management support is needed unless otherwise documented below in the visit note. 

## 2017-01-08 NOTE — Progress Notes (Signed)
   Subjective:    Patient ID: Catherine Freeman, female    DOB: 1991/02/11, 26 y.o.   MRN: [id number]  HPI The patient is a 26YO female coming in for changing her FMLA papers. She is currently set to have 3 days off per month. She had been doing better with her diarrhea. Lately she is having more problems. She feels that 5 days per month would be appropriate. She is doing okay with stress right now. No changes to her diet.   Review of Systems  Constitutional: Negative for activity change, appetite change, fatigue, fever and unexpected weight change.  HENT: Negative.   Eyes: Negative.   Respiratory: Negative.   Cardiovascular: Negative.   Gastrointestinal: Positive for abdominal pain and diarrhea. Negative for abdominal distention, blood in stool, constipation, nausea and vomiting.  Musculoskeletal: Negative.   Skin: Negative.   Neurological: Negative.   Psychiatric/Behavioral: Negative.       Objective:   Physical Exam  Constitutional: She is oriented to person, place, and time. She appears well-developed and well-nourished.  HENT:  Head: Normocephalic and atraumatic.  Eyes: EOM are normal.  Neck: Normal range of motion.  Cardiovascular: Normal rate and regular rhythm.   Pulmonary/Chest: Effort normal and breath sounds normal. No respiratory distress. She has no wheezes. She has no rales.  Abdominal: Soft. She exhibits no distension. There is no tenderness. There is no rebound and no guarding.  Musculoskeletal: She exhibits no edema.  Neurological: She is alert and oriented to person, place, and time.  Skin: Skin is warm and dry.   Vitals:   01/08/17 1401  BP: 122/68  Pulse: 82  Resp: 14  Temp: 98.1 F (36.7 C)  TempSrc: Oral  SpO2: 98%  Weight: 163 lb (73.9 kg)  Height: 5\' 3"  (1.6 m)      Assessment & Plan:

## 2017-01-09 ENCOUNTER — Encounter: Payer: Self-pay | Admitting: Internal Medicine

## 2017-03-25 DIAGNOSIS — Z0289 Encounter for other administrative examinations: Secondary | ICD-10-CM

## 2017-05-14 DIAGNOSIS — J3089 Other allergic rhinitis: Secondary | ICD-10-CM | POA: Diagnosis not present

## 2017-05-14 DIAGNOSIS — Z01419 Encounter for gynecological examination (general) (routine) without abnormal findings: Secondary | ICD-10-CM | POA: Diagnosis not present

## 2017-05-14 DIAGNOSIS — H1045 Other chronic allergic conjunctivitis: Secondary | ICD-10-CM | POA: Diagnosis not present

## 2017-05-14 DIAGNOSIS — L500 Allergic urticaria: Secondary | ICD-10-CM | POA: Diagnosis not present

## 2017-09-30 DIAGNOSIS — K219 Gastro-esophageal reflux disease without esophagitis: Secondary | ICD-10-CM | POA: Diagnosis not present

## 2017-09-30 DIAGNOSIS — R1310 Dysphagia, unspecified: Secondary | ICD-10-CM | POA: Diagnosis not present

## 2017-09-30 DIAGNOSIS — R195 Other fecal abnormalities: Secondary | ICD-10-CM | POA: Diagnosis not present

## 2018-01-06 DIAGNOSIS — K2930 Chronic superficial gastritis without bleeding: Secondary | ICD-10-CM | POA: Diagnosis not present

## 2018-01-06 DIAGNOSIS — K449 Diaphragmatic hernia without obstruction or gangrene: Secondary | ICD-10-CM | POA: Diagnosis not present

## 2018-01-06 DIAGNOSIS — R1310 Dysphagia, unspecified: Secondary | ICD-10-CM | POA: Diagnosis not present

## 2018-01-06 HISTORY — PX: UPPER GI ENDOSCOPY: SHX6162

## 2018-01-07 ENCOUNTER — Other Ambulatory Visit: Payer: Self-pay | Admitting: Gastroenterology

## 2018-01-07 DIAGNOSIS — K449 Diaphragmatic hernia without obstruction or gangrene: Secondary | ICD-10-CM

## 2018-01-09 ENCOUNTER — Ambulatory Visit
Admission: RE | Admit: 2018-01-09 | Discharge: 2018-01-09 | Disposition: A | Discharge status: Home / Self Care | Payer: 59 | Source: Ambulatory Visit | Attending: Gastroenterology | Admitting: Gastroenterology

## 2018-01-09 ENCOUNTER — Other Ambulatory Visit: Payer: Self-pay | Admitting: Gastroenterology

## 2018-01-09 DIAGNOSIS — R05 Cough: Secondary | ICD-10-CM | POA: Diagnosis not present

## 2018-01-09 DIAGNOSIS — R059 Cough, unspecified: Secondary | ICD-10-CM

## 2018-01-13 ENCOUNTER — Encounter (HOSPITAL_COMMUNITY): Payer: Self-pay | Admitting: *Deleted

## 2018-01-13 ENCOUNTER — Emergency Department (HOSPITAL_COMMUNITY)
Admission: EM | Admit: 2018-01-13 | Discharge: 2018-01-13 | Disposition: A | Discharge status: Home / Self Care | Payer: 59 | Attending: Emergency Medicine | Admitting: Emergency Medicine

## 2018-01-13 ENCOUNTER — Emergency Department (HOSPITAL_COMMUNITY): Payer: 59

## 2018-01-13 DIAGNOSIS — Z79899 Other long term (current) drug therapy: Secondary | ICD-10-CM | POA: Diagnosis not present

## 2018-01-13 DIAGNOSIS — R0602 Shortness of breath: Secondary | ICD-10-CM | POA: Diagnosis present

## 2018-01-13 DIAGNOSIS — R509 Fever, unspecified: Secondary | ICD-10-CM | POA: Diagnosis not present

## 2018-01-13 DIAGNOSIS — R05 Cough: Secondary | ICD-10-CM | POA: Diagnosis not present

## 2018-01-13 DIAGNOSIS — J189 Pneumonia, unspecified organism: Secondary | ICD-10-CM | POA: Diagnosis not present

## 2018-01-13 MED ORDER — LEVOFLOXACIN 500 MG PO TABS: 500.0000 mg | ORAL_TABLET | Freq: Every day | ORAL | 0 refills | Status: DC

## 2018-01-13 NOTE — ED Triage Notes (Signed)
Pt complains of cough, shortness of breath for the past week. Pt had endoscopy last weeks and chest x-ray 4 days ago at her GI doctor. Pt states her symptoms have been getting worse and would like to be evaluated.

## 2018-01-13 NOTE — ED Provider Notes (Signed)
[facility]-EMERGENCY DEPT Provider Note   CSN: [id number] Arrival date & time: 01/13/18  4098     History   Chief Complaint Chief Complaint  Patient presents with  . Shortness of Breath    HPI Catherine Freeman is a 27 y.o. female.  The history is provided by the patient. No language interpreter was used.  Shortness of Breath  This is a new problem. The average episode lasts 1 week. The problem occurs continuously.The current episode started more than 1 week ago. The problem has been gradually worsening. Associated symptoms include a fever and cough. She has tried nothing for the symptoms. The treatment provided no relief. She has had no prior hospitalizations. She has had no prior ICU admissions. Associated medical issues do not include asthma.  Pt complains of cough.  Pt developed cough after having and endoscopy  Past Medical History:  Diagnosis Date  . Allergy   . GERD (gastroesophageal reflux disease)   . IBS (irritable bowel syndrome)     Patient Active Problem List   Diagnosis Date Noted  . Social anxiety disorder 12/24/2016  . Routine general medical examination at a health care facility 10/26/2016  . Diarrhea 06/26/2016  . GERD (gastroesophageal reflux disease) 01/15/2015  . Allergic rhinitis 01/15/2015    History reviewed. No pertinent surgical history.  OB History    No data available       Home Medications    Prior to Admission medications   Medication Sig Start Date End Date Taking? Authorizing Provider  busPIRone (BUSPAR) 5 MG tablet Take 1 tablet (5 mg total) by mouth 2 (two) times daily as needed. 12/24/16   Myrlene Broker, MD  JUNEL FE 1.5/30 1.5-30 MG-MCG tablet Take 1 tablet by mouth daily. 10/16/16   [provider]  levocetirizine (XYZAL) 5 MG tablet Take 5 mg by mouth at bedtime.  01/12/15   [provider]  norethindrone-ethinyl estradiol (JUNEL FE,GILDESS FE,LOESTRIN FE) 1-20 MG-MCG tablet Take 1  tablet by mouth daily.    [provider]  pantoprazole (PROTONIX) 20 MG tablet TAKE 1 TABLET (20 MG TOTAL) BY MOUTH DAILY. 09/19/16   Myrlene Broker, MD  ranitidine (ZANTAC) 150 MG tablet Take 1 tablet (150 mg total) by mouth 2 (two) times daily. 12/24/16   Myrlene Broker, MD    Family History Family History  Problem Relation Age of Onset  . Diabetes Father   . Diabetes Sister   . Prostate cancer Paternal Grandfather     Social History Social History   Tobacco Use  . Smoking status: Never Smoker  . Smokeless tobacco: Never Used  Substance Use Topics  . Alcohol use: Yes    Comment: socially  . Drug use: No     Allergies   Dust mite extract   Review of Systems Review of Systems  Constitutional: Positive for fever.  Respiratory: Positive for cough and shortness of breath.   All other systems reviewed and are negative.    Physical Exam Updated Vital Signs BP (!) 146/84 (BP Location: Left Arm)   Pulse (!) 114   Temp 98.3 F (36.8 C) (Oral)   Resp 16   LMP 12/19/2017 (Approximate)   SpO2 96%   Physical Exam  Constitutional: She appears well-developed and well-nourished. No distress.  HENT:  Head: Normocephalic and atraumatic.  Mouth/Throat: Oropharynx is clear and moist.  Eyes: Conjunctivae are normal. Pupils are equal, round, and reactive to light.  Neck: Neck supple.  Cardiovascular: Normal  rate and regular rhythm.  No murmur heard. Pulmonary/Chest: Effort normal and breath sounds normal. No respiratory distress.  Abdominal: Soft. There is no tenderness.  Musculoskeletal: She exhibits no edema.  Neurological: She is alert.  Skin: Skin is warm and dry.  Psychiatric: She has a normal mood and affect.  Nursing note and vitals reviewed.    ED Treatments / Results  Labs (all labs ordered are listed, but only abnormal results are displayed) Labs Reviewed - No data to display  EKG  EKG Interpretation  Date/Time:  Monday January 13 2018 07:22:01 EST Ventricular Rate:  102 PR Interval:    QRS Duration: 91 QT Interval:  326 QTC Calculation: 425 R Axis:   22 Text Interpretation:  Sinus tachycardia Otherwise within normal limits Confirmed by Gilda CreasePollina, Christopher J [phone]([id number]) on 01/13/2018 7:33:30 AM       Radiology Dg Chest 2 View  Result Date: 01/13/2018 CLINICAL DATA:  Pt complains of cough, shortness of breath for the past week. Pt had endoscopy last weeks and chest x-ray 4 days ago at her GI doctor. Pt states her symptoms have been getting worse and would like to be evaluated EXAM: CHEST - 2 VIEW COMPARISON:  01/09/2018 FINDINGS: Subsegmental atelectasis or consolidation in the posterior right lower lobe, partially obscuring the right diaphragmatic leaflet. Left lung clear. Heart size upper limits normal. No effusion. Visualized bones unremarkable. IMPRESSION: 1. Focal infiltrate or subsegmental atelectasis in the posterior right lower lobe. Electronically Signed   By: Corlis Leak  Hassell M.D.   On: 01/13/2018 08:26    Procedures Procedures (including critical care time)  Medications Ordered in ED Medications - No data to display   Initial Impression / Assessment and Plan / ED Course  I have reviewed the triage vital signs and the nursing notes.  Pertinent labs & imaging results that were available during my care of the patient were reviewed by me and considered in my medical decision making (see chart for details).     MDM  Chest xray reviewed pt has infiltrate in right lower lobe.   Pt advised she needs to see her MD for recheck in 2-3 days  I will start pt on Levaquin.   Final Clinical Impressions(s) / ED Diagnoses   Final diagnoses:  Community acquired pneumonia of right lung, unspecified part of lung    ED Discharge Orders    None     Meds ordered this encounter  Medications  . levofloxacin (LEVAQUIN) 500 MG tablet    Sig: Take 1 tablet (500 mg total) by mouth daily.    Dispense:  10 tablet    Refill:   0    Order Specific Question:   Supervising Provider    Answer:   Eber HongMILLER, BRIAN [[id number]]   An After Visit Summary was printed and given to the patient.   Elson AreasSofia, Hazim Treadway K, [place]PA-C 01/13/18 [id number]    Lorre NickAllen, Anthony, MD 01/13/18 [phone]

## 2018-01-17 ENCOUNTER — Ambulatory Visit
Admission: RE | Admit: 2018-01-17 | Discharge: 2018-01-17 | Disposition: A | Discharge status: Home / Self Care | Payer: 59 | Source: Ambulatory Visit | Attending: Gastroenterology | Admitting: Gastroenterology

## 2018-01-17 ENCOUNTER — Other Ambulatory Visit: Payer: Self-pay | Admitting: Gastroenterology

## 2018-01-17 DIAGNOSIS — K449 Diaphragmatic hernia without obstruction or gangrene: Secondary | ICD-10-CM

## 2018-01-23 ENCOUNTER — Ambulatory Visit (INDEPENDENT_AMBULATORY_CARE_PROVIDER_SITE_OTHER)
Admission: RE | Admit: 2018-01-23 | Discharge: 2018-01-23 | Disposition: A | Discharge status: Home / Self Care | Payer: 59 | Source: Ambulatory Visit | Attending: Internal Medicine | Admitting: Internal Medicine

## 2018-01-23 ENCOUNTER — Ambulatory Visit: Payer: 59 | Admitting: Internal Medicine

## 2018-01-23 ENCOUNTER — Encounter: Payer: Self-pay | Admitting: Internal Medicine

## 2018-01-23 VITALS — BP 110/60 | HR 95 | Temp 98.4°F | Ht 63.0 in | Wt 176.0 lb

## 2018-01-23 DIAGNOSIS — J189 Pneumonia, unspecified organism: Secondary | ICD-10-CM | POA: Diagnosis not present

## 2018-01-23 DIAGNOSIS — K219 Gastro-esophageal reflux disease without esophagitis: Secondary | ICD-10-CM | POA: Diagnosis not present

## 2018-01-23 DIAGNOSIS — R05 Cough: Secondary | ICD-10-CM | POA: Diagnosis not present

## 2018-01-23 MED ORDER — BENZONATATE 200 MG PO CAPS: 200.0000 mg | ORAL_CAPSULE | Freq: Three times a day (TID) | ORAL | 0 refills | Status: DC | PRN

## 2018-01-23 NOTE — Progress Notes (Signed)
   Subjective:    Patient ID: Catherine Freeman, female    DOB: 1991-06-13, 27 y.o.   MRN: [id number]  HPI The patient is a 27 YO Female coming in for ER follow up. She was diagnosed with CAP post endoscopy. She started coughing day after endoscopy and had x-ray which was negative and then several days later some more problems breathing and coughing and so she went to ER and found to have pneumonia. Given 10 day course of levaquin which she took and finished about 2 days prior. No fevers or chills now and coughing much less. She is not have SOB. No sputum production.   [facility], social history reviewed and updated.   Review of Systems  Constitutional: Positive for activity change. Negative for appetite change, chills, fatigue, fever and unexpected weight change.  HENT: Negative.   Eyes: Negative.   Respiratory: Positive for cough. Negative for chest tightness, shortness of breath and wheezing.   Cardiovascular: Negative for chest pain, palpitations and leg swelling.  Gastrointestinal: Negative for abdominal distention, abdominal pain, constipation, diarrhea, nausea and vomiting.  Musculoskeletal: Negative.   Skin: Negative.   Neurological: Negative.   Psychiatric/Behavioral: Negative.       Objective:   Physical Exam  Constitutional: She is oriented to person, place, and time. She appears well-developed and well-nourished.  HENT:  Head: Normocephalic and atraumatic.  Eyes: EOM are normal.  Neck: Normal range of motion.  Cardiovascular: Normal rate and regular rhythm.  Pulmonary/Chest: Effort normal and breath sounds normal. No respiratory distress. She has no wheezes. She has no rales.  Abdominal: Soft. Bowel sounds are normal. She exhibits no distension. There is no tenderness. There is no rebound.  Musculoskeletal: She exhibits no edema.  Neurological: She is alert and oriented to person, place, and time. Coordination normal.  Skin: Skin is warm and dry.  Psychiatric: She has a normal  mood and affect.   Vitals:   01/23/18 1447  BP: 110/60  Pulse: 95  Temp: 98.4 F (36.9 C)  TempSrc: Oral  SpO2: 99%  Weight: 176 lb (79.8 kg)  Height: 5\' 3"  (1.6 m)      Assessment & Plan:

## 2018-01-23 NOTE — Assessment & Plan Note (Signed)
Finished levaquin and still coughing. Rx for tessalon perles. Checking CXR but warned her that this is too early for all signs to be gone. May need repeat in 1 month. This will assess regression and if progression needs additional treatment although I suspect this was aspiration pneumonia from endoscopy.

## 2018-01-23 NOTE — Patient Instructions (Signed)
We have sent in tessalon perles that you can take up to 3 times per day for cough. The cough can take another 2-3 weeks to go away.  We are checking the chest x-ray today.

## 2018-01-24 ENCOUNTER — Telehealth: Payer: Self-pay

## 2018-01-24 NOTE — Telephone Encounter (Signed)
Error

## 2018-03-04 DIAGNOSIS — Z3009 Encounter for other general counseling and advice on contraception: Secondary | ICD-10-CM | POA: Diagnosis not present

## 2018-03-12 ENCOUNTER — Other Ambulatory Visit: Payer: Self-pay | Admitting: Internal Medicine

## 2018-03-13 ENCOUNTER — Other Ambulatory Visit: Payer: Self-pay | Admitting: Internal Medicine

## 2018-03-24 ENCOUNTER — Ambulatory Visit: Payer: 59 | Admitting: Internal Medicine

## 2018-03-24 ENCOUNTER — Encounter: Payer: Self-pay | Admitting: Internal Medicine

## 2018-03-24 DIAGNOSIS — R05 Cough: Secondary | ICD-10-CM | POA: Diagnosis not present

## 2018-03-24 DIAGNOSIS — R059 Cough, unspecified: Secondary | ICD-10-CM

## 2018-03-24 NOTE — Patient Instructions (Signed)
This is likely coming from allergies.

## 2018-03-24 NOTE — Progress Notes (Signed)
   Subjective:    Patient ID: Catherine Freeman, female    DOB: 08-27-1991, 27 y.o.   MRN: [id number]  HPI The patient is a 27 YO female coming in for cough. Had CAP back in February and is concerned about this. Denies fevers or chills. Denies taking anything for allergies currently. She has been on allergy medicine in the past. She denies SOB. Denies change in activity. Overall gradually improving. Not coughing as much in the last day or so. Some congestion and drainage.   Review of Systems  Constitutional: Negative for activity change, appetite change, chills, fatigue, fever and unexpected weight change.  HENT: Positive for congestion and postnasal drip. Negative for ear discharge, ear pain, rhinorrhea, sinus pressure, sinus pain, sneezing, sore throat, tinnitus, trouble swallowing and voice change.   Eyes: Negative.   Respiratory: Positive for cough. Negative for chest tightness, shortness of breath and wheezing.   Cardiovascular: Negative.   Gastrointestinal: Negative.   Neurological: Negative.       Objective:   Physical Exam  Constitutional: She is oriented to person, place, and time. She appears well-developed and well-nourished.  HENT:  Head: Normocephalic and atraumatic.  Erythema of the oropharynx with mild clear drainage, nose without crusting and no sinus pressure. TMs normal.   Eyes: EOM are normal.  Neck: Normal range of motion.  Cardiovascular: Normal rate and regular rhythm.  Pulmonary/Chest: Effort normal and breath sounds normal. No respiratory distress. She has no wheezes. She has no rales.  Abdominal: Soft.  Neurological: She is alert and oriented to person, place, and time. Coordination normal.  Skin: Skin is warm and dry.   Vitals:   03/24/18 1356  BP: 100/60  Pulse: 71  Temp: 98.1 F (36.7 C)  TempSrc: Oral  SpO2: 92%  Weight: 170 lb (77.1 kg)  Height: 5\' 3"  (1.6 m)      Assessment & Plan:

## 2018-03-25 DIAGNOSIS — R059 Cough, unspecified: Secondary | ICD-10-CM | POA: Insufficient documentation

## 2018-03-25 DIAGNOSIS — R05 Cough: Secondary | ICD-10-CM | POA: Insufficient documentation

## 2018-03-25 NOTE — Assessment & Plan Note (Signed)
Lungs clear on exam and advised to take otc allergy medicine to help with drainage. Also she asked about mucinex which is safe to take if needed. Can us cough medicine if needed.

## 2018-04-01 DIAGNOSIS — M5412 Radiculopathy, cervical region: Secondary | ICD-10-CM | POA: Diagnosis not present

## 2018-04-01 DIAGNOSIS — M9901 Segmental and somatic dysfunction of cervical region: Secondary | ICD-10-CM | POA: Diagnosis not present

## 2018-04-01 DIAGNOSIS — M25542 Pain in joints of left hand: Secondary | ICD-10-CM | POA: Diagnosis not present

## 2018-04-14 ENCOUNTER — Other Ambulatory Visit: Payer: Self-pay | Admitting: Internal Medicine

## 2018-04-21 DIAGNOSIS — M9901 Segmental and somatic dysfunction of cervical region: Secondary | ICD-10-CM | POA: Diagnosis not present

## 2018-04-21 DIAGNOSIS — M5412 Radiculopathy, cervical region: Secondary | ICD-10-CM | POA: Diagnosis not present

## 2018-04-21 DIAGNOSIS — M25542 Pain in joints of left hand: Secondary | ICD-10-CM | POA: Diagnosis not present

## 2018-04-23 DIAGNOSIS — M5412 Radiculopathy, cervical region: Secondary | ICD-10-CM | POA: Diagnosis not present

## 2018-04-23 DIAGNOSIS — M9901 Segmental and somatic dysfunction of cervical region: Secondary | ICD-10-CM | POA: Diagnosis not present

## 2018-04-23 DIAGNOSIS — M25542 Pain in joints of left hand: Secondary | ICD-10-CM | POA: Diagnosis not present

## 2018-04-24 DIAGNOSIS — M9901 Segmental and somatic dysfunction of cervical region: Secondary | ICD-10-CM | POA: Diagnosis not present

## 2018-04-24 DIAGNOSIS — M5412 Radiculopathy, cervical region: Secondary | ICD-10-CM | POA: Diagnosis not present

## 2018-04-24 DIAGNOSIS — M25542 Pain in joints of left hand: Secondary | ICD-10-CM | POA: Diagnosis not present

## 2018-05-20 DIAGNOSIS — Z01419 Encounter for gynecological examination (general) (routine) without abnormal findings: Secondary | ICD-10-CM | POA: Diagnosis not present

## 2018-05-20 DIAGNOSIS — Z3041 Encounter for surveillance of contraceptive pills: Secondary | ICD-10-CM | POA: Diagnosis not present

## 2018-05-20 LAB — HM PAP SMEAR

## 2018-06-20 ENCOUNTER — Other Ambulatory Visit (INDEPENDENT_AMBULATORY_CARE_PROVIDER_SITE_OTHER): Payer: 59

## 2018-06-20 ENCOUNTER — Ambulatory Visit (INDEPENDENT_AMBULATORY_CARE_PROVIDER_SITE_OTHER): Payer: 59 | Admitting: Internal Medicine

## 2018-06-20 ENCOUNTER — Encounter: Payer: Self-pay | Admitting: Internal Medicine

## 2018-06-20 VITALS — BP 108/68 | HR 67 | Temp 98.5°F | Ht 63.0 in | Wt 169.0 lb

## 2018-06-20 DIAGNOSIS — Z0000 Encounter for general adult medical examination without abnormal findings: Secondary | ICD-10-CM

## 2018-06-20 DIAGNOSIS — K219 Gastro-esophageal reflux disease without esophagitis: Secondary | ICD-10-CM | POA: Diagnosis not present

## 2018-06-20 DIAGNOSIS — R197 Diarrhea, unspecified: Secondary | ICD-10-CM | POA: Diagnosis not present

## 2018-06-20 DIAGNOSIS — J3089 Other allergic rhinitis: Secondary | ICD-10-CM

## 2018-06-20 LAB — LIPID PANEL
CHOLESTEROL: 158 mg/dL (ref 0–200)
HDL: 38.7 mg/dL — ABNORMAL LOW (ref >39.00)
LDL Cholesterol: 104 mg/dL — ABNORMAL HIGH (ref 0–99)
NonHDL: 119.19
TRIGLYCERIDES: 78 mg/dL (ref 0.0–149.0)
Total CHOL/HDL Ratio: 4
VLDL: 15.6 mg/dL (ref 0.0–40.0)

## 2018-06-20 LAB — COMPREHENSIVE METABOLIC PANEL
ALBUMIN: 4 g/dL (ref 3.5–5.2)
ALK PHOS: 73 U/L (ref 39–117)
ALT: 14 U/L (ref 0–35)
AST: 13 U/L (ref 0–37)
BUN: 8 mg/dL (ref 6–23)
CALCIUM: 9.2 mg/dL (ref 8.4–10.5)
CHLORIDE: 105 meq/L (ref 96–112)
CO2: 28 mEq/L (ref 19–32)
Creatinine, Ser: 0.57 mg/dL (ref 0.40–1.20)
GFR: 134.13 mL/min (ref >60.00)
Glucose, Bld: 95 mg/dL (ref 70–99)
Potassium: 4 mEq/L (ref 3.5–5.1)
SODIUM: 139 meq/L (ref 135–145)
Total Bilirubin: 0.3 mg/dL (ref 0.2–1.2)
Total Protein: 7.2 g/dL (ref 6.0–8.3)

## 2018-06-20 LAB — CBC
HEMATOCRIT: 40.7 % (ref 36.0–46.0)
HEMOGLOBIN: 13.5 g/dL (ref 12.0–15.0)
MCHC: 33.3 g/dL (ref 30.0–36.0)
MCV: 89 fl (ref 78.0–100.0)
PLATELETS: 265 10*3/uL (ref 150.0–400.0)
RBC: 4.57 Mil/uL (ref 3.87–5.11)
RDW: 12.9 % (ref 11.5–15.5)
WBC: 7.8 10*3/uL (ref 4.0–10.5)

## 2018-06-20 LAB — HEMOGLOBIN A1C: HEMOGLOBIN A1C: 5.9 % (ref 4.6–6.5)

## 2018-06-20 NOTE — Assessment & Plan Note (Signed)
Pap smear up to date. Counseled about flu shot yearly. Tetanus up to date. STD screening done today. Counseled about alcohol intake, dangers of distracted driving, exercise. Given screening recommendations.

## 2018-06-20 NOTE — Assessment & Plan Note (Signed)
Some mild IBS which is controlled currently. She is aware of some dietary triggers and generally avoids those.

## 2018-06-20 NOTE — Assessment & Plan Note (Signed)
Taking protonix daily and controlled. Can continue for now as benefit outweighs risk.

## 2018-06-20 NOTE — Patient Instructions (Signed)

## 2018-06-20 NOTE — Progress Notes (Signed)
   Subjective:    Patient ID: Catherine Freeman, female    DOB: 04-Dec-1991, 27 y.o.   MRN: [id number]  HPI The patient is a 27 YO female coming in for physical. Recent PAP normal with gyn.   [facility], [facility], social history reviewed and updated.   Review of Systems  Constitutional: Negative.   HENT: Negative.   Eyes: Negative.   Respiratory: Negative for cough, chest tightness and shortness of breath.   Cardiovascular: Negative for chest pain, palpitations and leg swelling.  Gastrointestinal: Negative for abdominal distention, abdominal pain, constipation, diarrhea, nausea and vomiting.  Musculoskeletal: Negative.   Skin: Negative.   Neurological: Negative.   Psychiatric/Behavioral: Negative.       Objective:   Physical Exam  Constitutional: She is oriented to person, place, and time. She appears well-developed and well-nourished.  HENT:  Head: Normocephalic and atraumatic.  Eyes: EOM are normal.  Neck: Normal range of motion.  Cardiovascular: Normal rate and regular rhythm.  Pulmonary/Chest: Effort normal and breath sounds normal. No respiratory distress. She has no wheezes. She has no rales.  Abdominal: Soft. Bowel sounds are normal. She exhibits no distension. There is no tenderness. There is no rebound.  Musculoskeletal: She exhibits no edema.  Neurological: She is alert and oriented to person, place, and time. Coordination normal.  Skin: Skin is warm and dry.  Psychiatric: She has a normal mood and affect.   Vitals:   06/20/18 0831  BP: 108/68  Pulse: 67  Temp: 98.5 F (36.9 C)  TempSrc: Oral  SpO2: 96%  Weight: 169 lb (76.7 kg)  Height: 5\' 3"  (1.6 m)      Assessment & Plan:

## 2018-06-20 NOTE — Assessment & Plan Note (Signed)
Taking xyzal and controlled currently.

## 2018-06-21 LAB — HIV ANTIBODY (ROUTINE TESTING W REFLEX): HIV 1&2 Ab, 4th Generation: NONREACTIVE

## 2018-06-21 LAB — GC/CHLAMYDIA PROBE AMP
CHLAMYDIA, DNA PROBE: NEGATIVE
NEISSERIA GONORRHOEAE BY PCR: NEGATIVE

## 2018-08-27 DIAGNOSIS — J069 Acute upper respiratory infection, unspecified: Secondary | ICD-10-CM | POA: Diagnosis not present

## 2018-10-26 ENCOUNTER — Other Ambulatory Visit: Payer: Self-pay | Admitting: Internal Medicine

## 2018-12-18 HISTORY — PX: ROOT CANAL: SHX2363

## 2019-01-29 ENCOUNTER — Ambulatory Visit: Payer: 59 | Admitting: Internal Medicine

## 2019-01-29 ENCOUNTER — Encounter: Payer: Self-pay | Admitting: Internal Medicine

## 2019-01-29 DIAGNOSIS — B9789 Other viral agents as the cause of diseases classified elsewhere: Secondary | ICD-10-CM | POA: Diagnosis not present

## 2019-01-29 DIAGNOSIS — J069 Acute upper respiratory infection, unspecified: Secondary | ICD-10-CM | POA: Insufficient documentation

## 2019-01-29 NOTE — Assessment & Plan Note (Signed)
Resolving and lungs clear. No antibiotics or steroids indicated.

## 2019-01-29 NOTE — Progress Notes (Signed)
   Subjective:   Patient ID: Catherine Freeman, female    DOB: 04/08/1991, 28 y.o.   MRN: [id number]  HPI The patient is a 28 y.o. female coming in for cold symptoms. Started about 1 week ago. Main symptoms are: cough, congestion. Denies SOB or fevers or chills. Overall it is improving and only mild cough remains. Has tried xyzal which is helping some. Was concerned due to pneumonia last year and did want to get her lungs checked.   Review of Systems  Constitutional: Positive for activity change. Negative for appetite change, chills, fatigue, fever and unexpected weight change.  HENT: Positive for congestion and postnasal drip. Negative for ear discharge, ear pain, rhinorrhea, sinus pressure, sinus pain, sneezing, sore throat, tinnitus, trouble swallowing and voice change.   Eyes: Negative.   Respiratory: Positive for cough. Negative for chest tightness, shortness of breath and wheezing.   Cardiovascular: Negative.   Gastrointestinal: Negative.   Musculoskeletal: Negative for myalgias.  Neurological: Negative.     Objective:  Physical Exam Constitutional:      Appearance: She is well-developed.  HENT:     Head: Normocephalic and atraumatic.     Comments: Oropharynx with redness and clear drainage, nose with swollen turbinates, TMs normal bilaterally.  Neck:     Musculoskeletal: Normal range of motion.     Thyroid: No thyromegaly.  Cardiovascular:     Rate and Rhythm: Normal rate and regular rhythm.  Pulmonary:     Effort: Pulmonary effort is normal. No respiratory distress.     Breath sounds: Normal breath sounds. No wheezing or rales.  Abdominal:     Palpations: Abdomen is soft.  Musculoskeletal:        General: No tenderness.  Lymphadenopathy:     Cervical: No cervical adenopathy.  Skin:    General: Skin is warm and dry.  Neurological:     Mental Status: She is alert and oriented to person, place, and time.     Vitals:   01/29/19 0806  BP: 118/78  Pulse: 84  Temp: 98.3 F  (36.8 C)  TempSrc: Oral  SpO2: 97%  Weight: 173 lb (78.5 kg)  Height: 5\' 3"  (1.6 m)    Assessment & Plan:

## 2019-01-29 NOTE — Patient Instructions (Signed)
It is okay to use over the counter stuff for cough.

## 2019-02-09 ENCOUNTER — Ambulatory Visit (INDEPENDENT_AMBULATORY_CARE_PROVIDER_SITE_OTHER): Payer: 59

## 2019-02-09 DIAGNOSIS — Z299 Encounter for prophylactic measures, unspecified: Secondary | ICD-10-CM

## 2019-02-09 DIAGNOSIS — Z23 Encounter for immunization: Secondary | ICD-10-CM | POA: Diagnosis not present

## 2019-02-14 ENCOUNTER — Other Ambulatory Visit: Payer: Self-pay | Admitting: Internal Medicine

## 2019-02-26 IMAGING — CR DG CHEST 2V
2 series · 2 of 2 positions shown · non-contrast
Comparison: None in PACs

CLINICAL DATA: Cough and shortness of breath beginning 4 days ago
after endoscopy. History of hiatal hernia

EXAM:
CHEST  2 VIEW

[w chest pa]
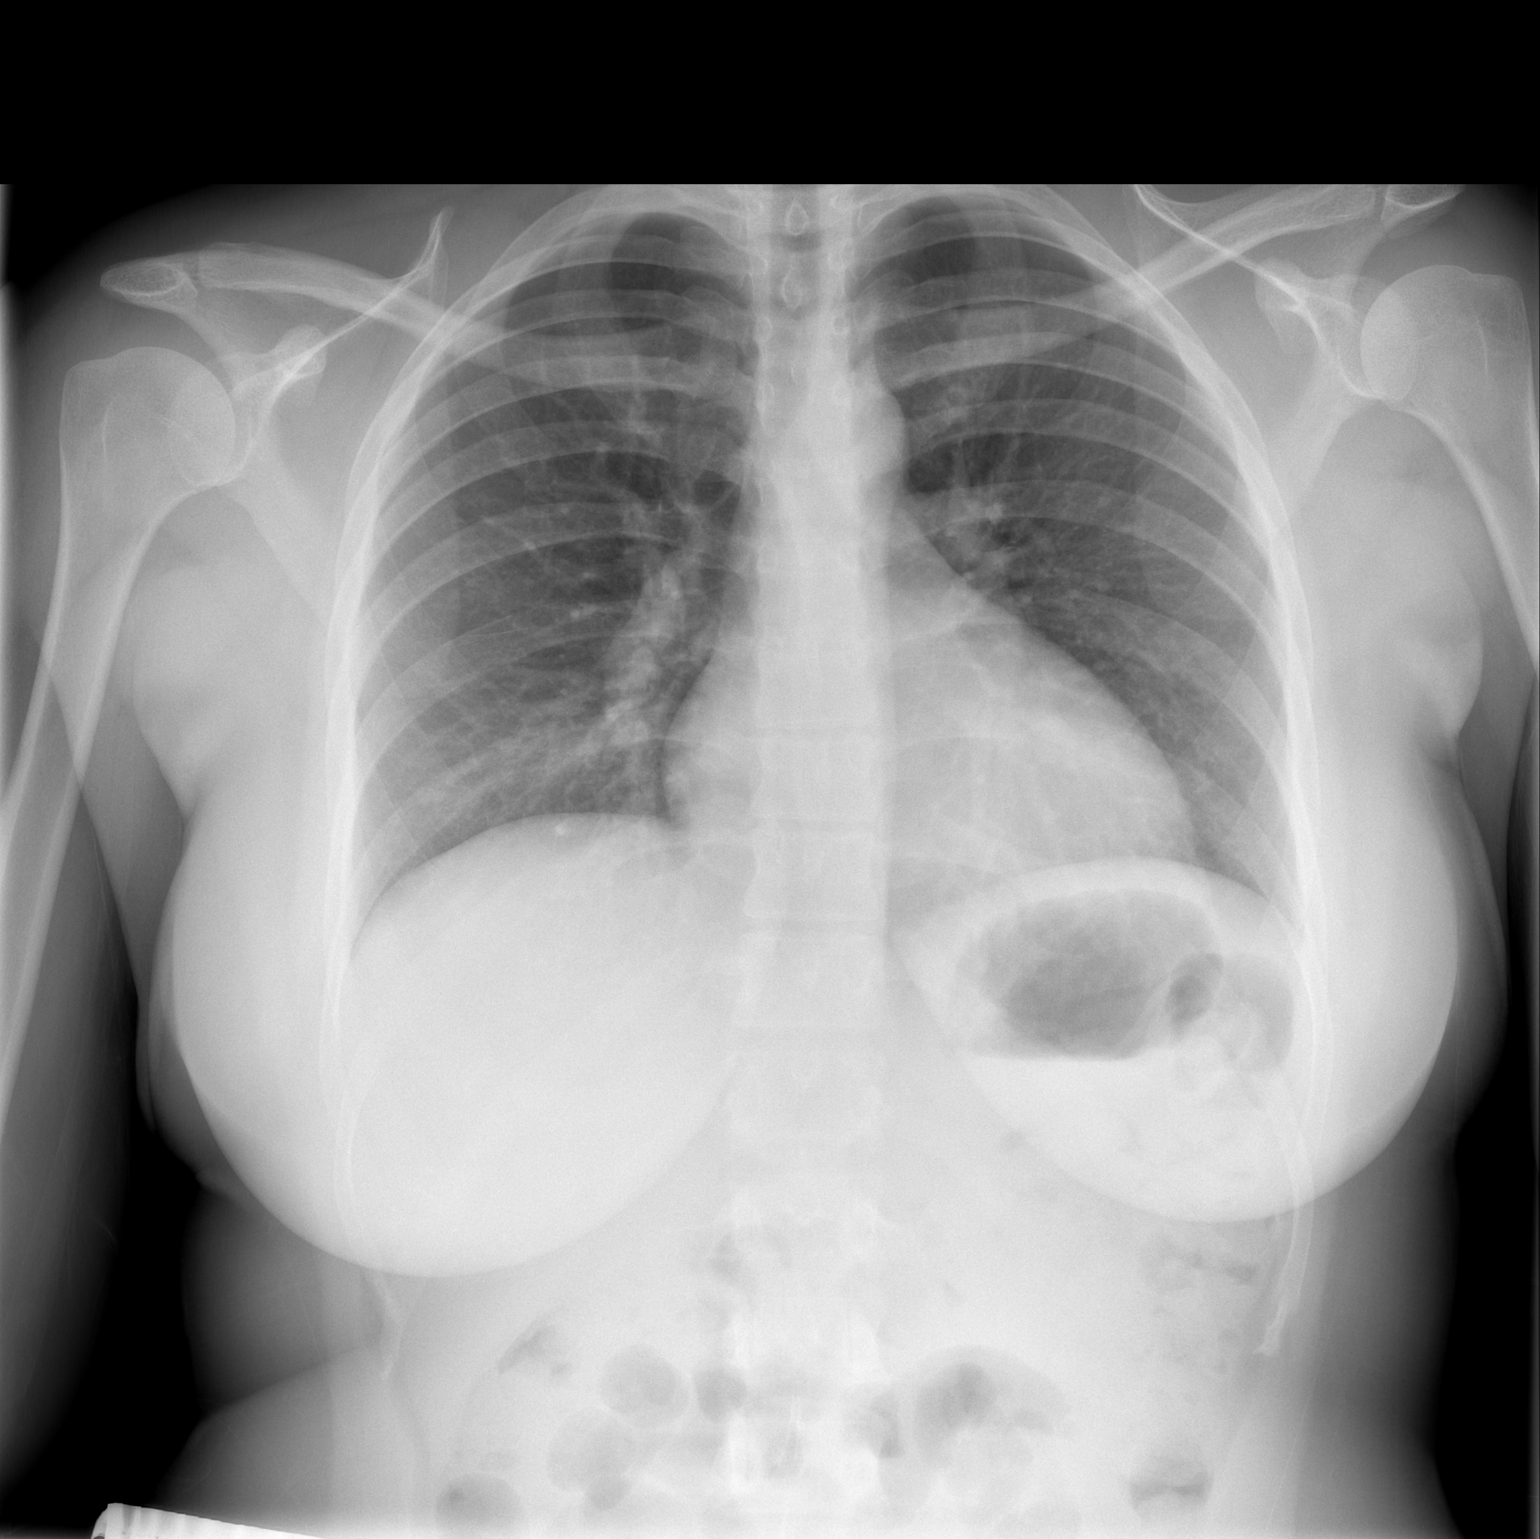

[w chest lat]
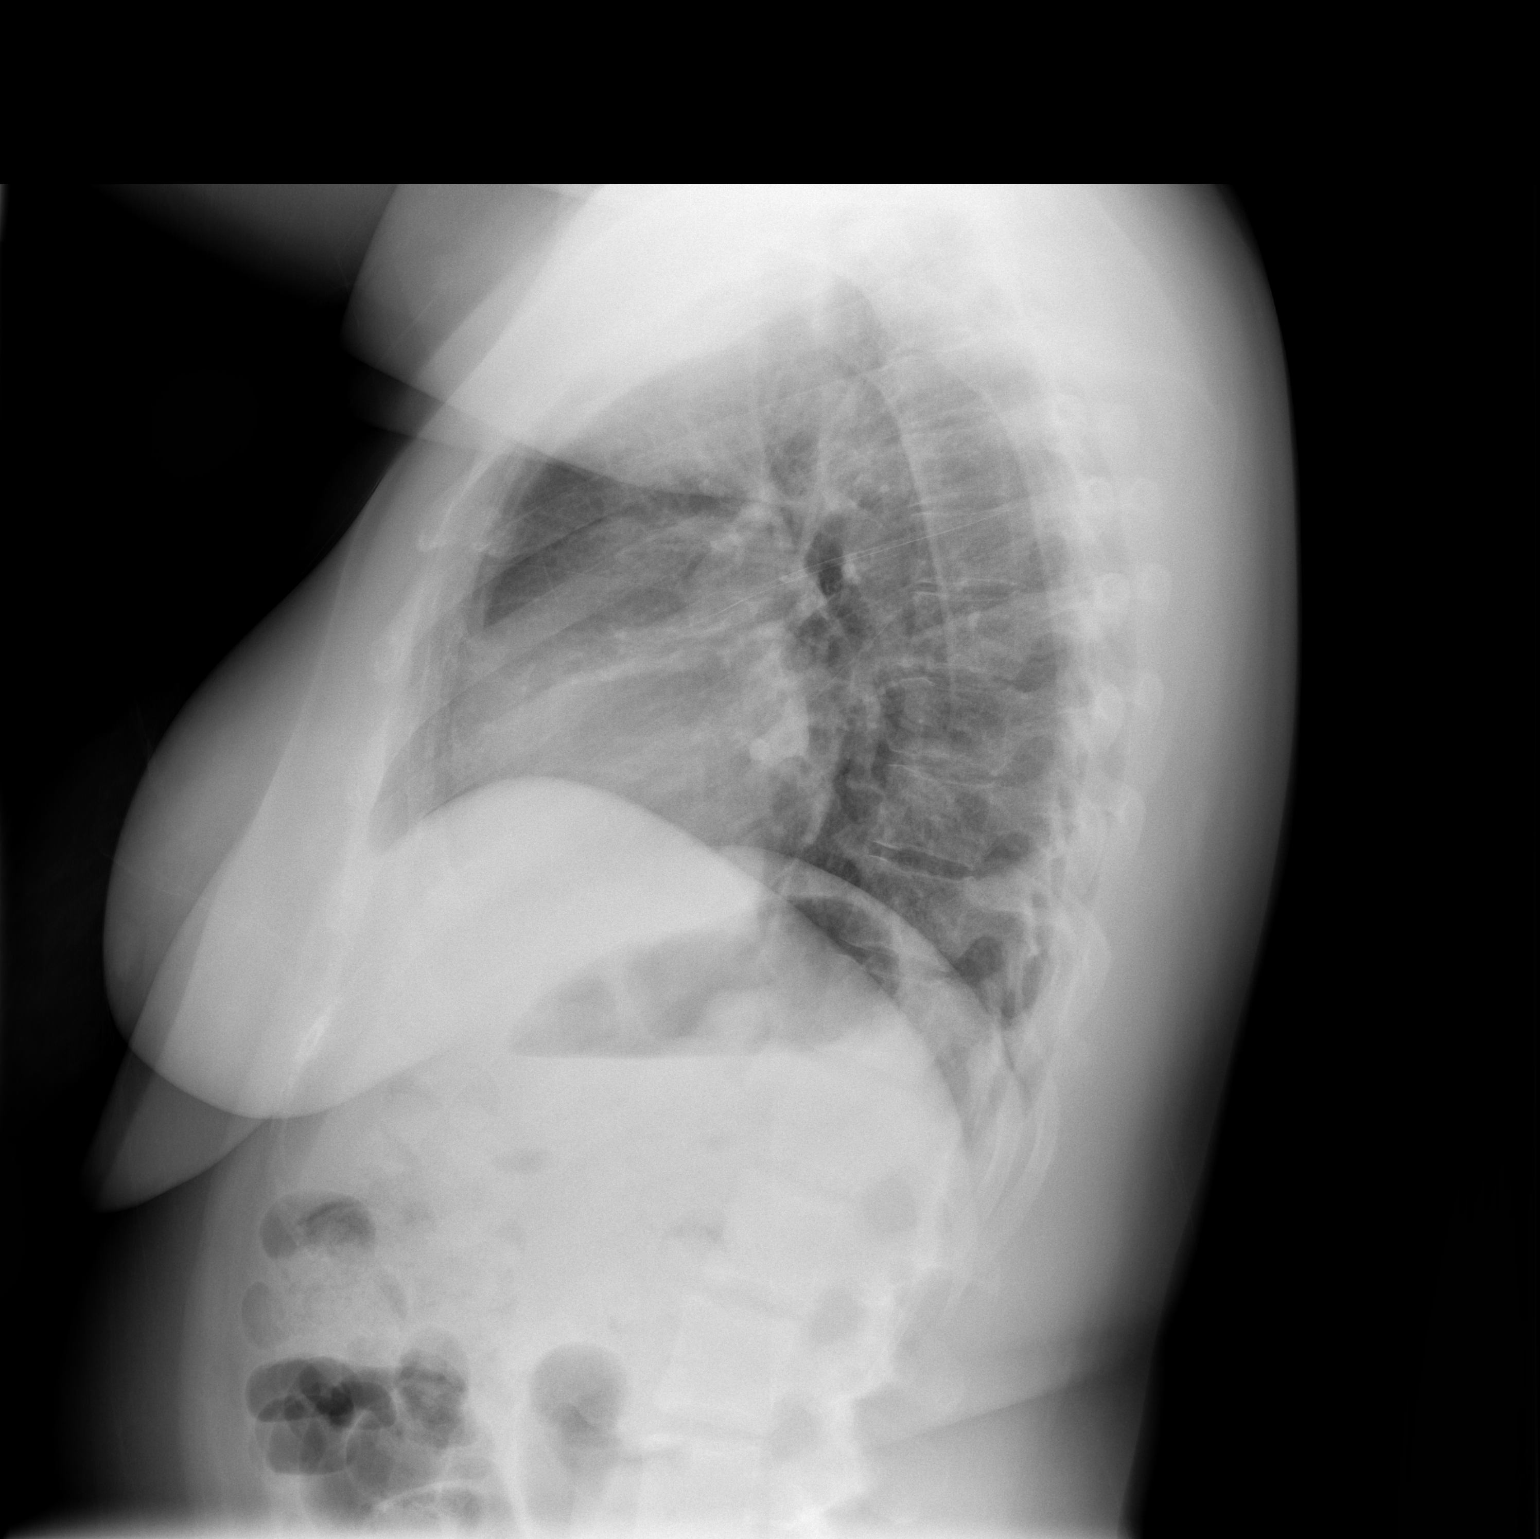

[2 of 2 positions shown; findings below may reference images not displayed]

FINDINGS: There is patchy increased density in the right infrahilar region.
There is no alveolar infiltrate. There is no pleural effusion. The
heart and pulmonary vascularity are normal. The mediastinum is
normal in width.
IMPRESSION: Coarse lung markings inferiorly on the right likely reflects
subsegmental atelectasis. There is no discrete pneumonia.

## 2019-03-06 ENCOUNTER — Ambulatory Visit: Payer: 59 | Admitting: Internal Medicine

## 2019-03-06 ENCOUNTER — Encounter: Payer: Self-pay | Admitting: Internal Medicine

## 2019-03-06 ENCOUNTER — Other Ambulatory Visit: Payer: Self-pay

## 2019-03-06 DIAGNOSIS — K219 Gastro-esophageal reflux disease without esophagitis: Secondary | ICD-10-CM

## 2019-03-06 DIAGNOSIS — F4010 Social phobia, unspecified: Secondary | ICD-10-CM | POA: Diagnosis not present

## 2019-03-06 IMAGING — RF DG UGI W/ KUB
9 series · 14 of 24 positions shown · non-contrast
Comparison: None.

CLINICAL DATA: Dysphagia. Recent endoscopy with hiatal hernia
found.

EXAM:
UPPER GI SERIES WITH KUB
TECHNIQUE: After obtaining a scout radiograph a routine upper GI series was
performed using thick and thin density barium
FLUOROSCOPY TIME:  Fluoroscopy Time:  2 minutes and 42 seconds
Radiation Exposure Index (if provided by the fluoroscopic device):
169 mGy
Number of Acquired Spot Images: 0

[Series 1: one shot · 1 of 1 slices shown (1 of 3)]
[im 1/1]
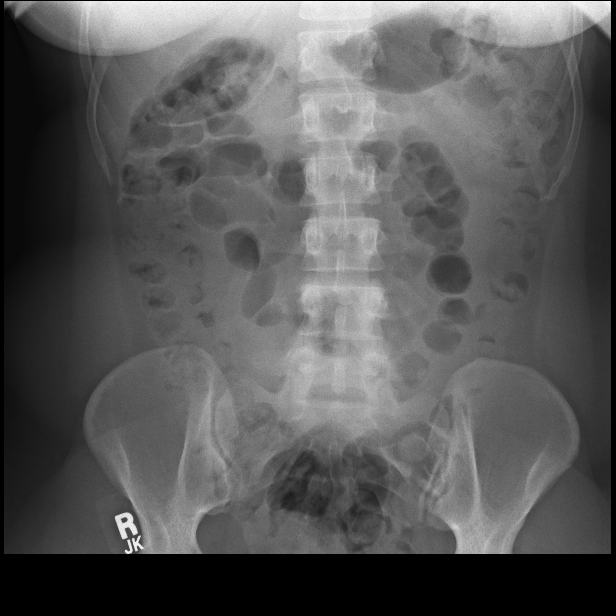

[Series 2: sequence · 1 of 22 frames shown (1 of 6)]
[frame 12/22]
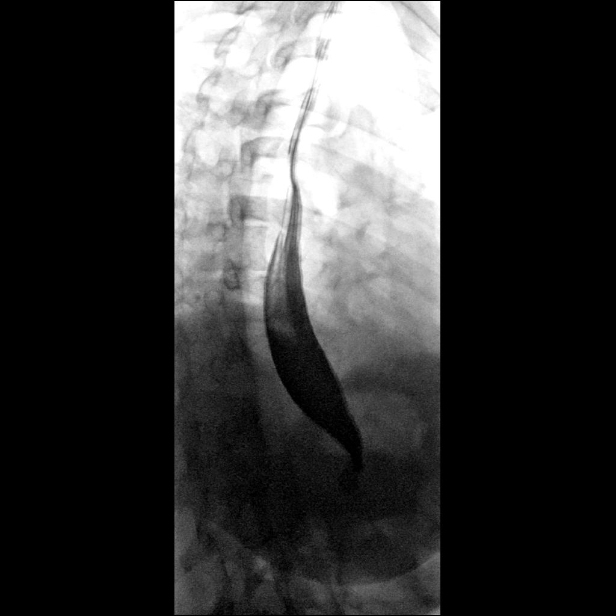

[Series 4: sequence · 2 of 28 frames shown (2 of 6)]
[frame 2/28]
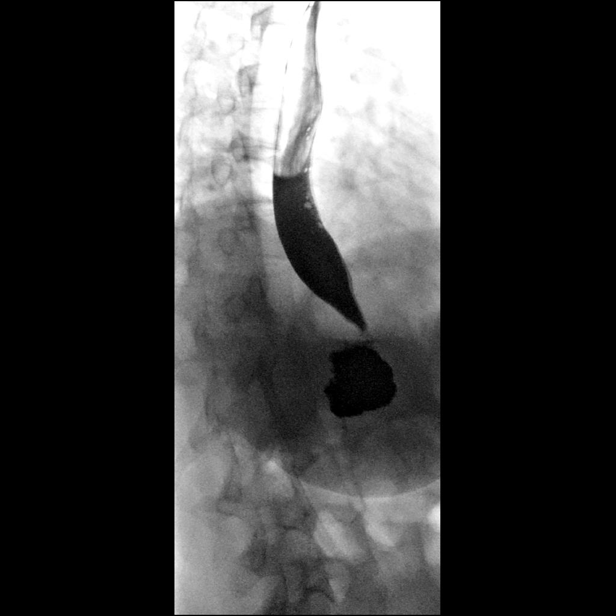
[frame 24/28]
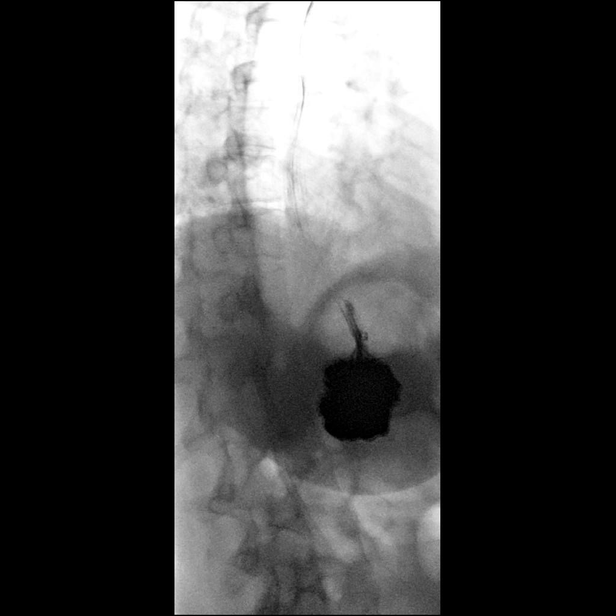

[Series 5: one shot · 1 of 3 slices shown (2 of 3)]
[im 1/3]
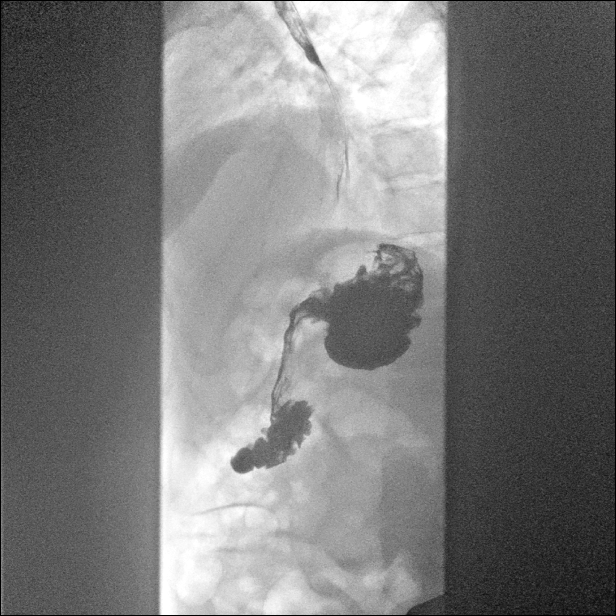

[Series 6: sequence · 2 of 17 frames shown (3 of 6)]
[frame 3/17]
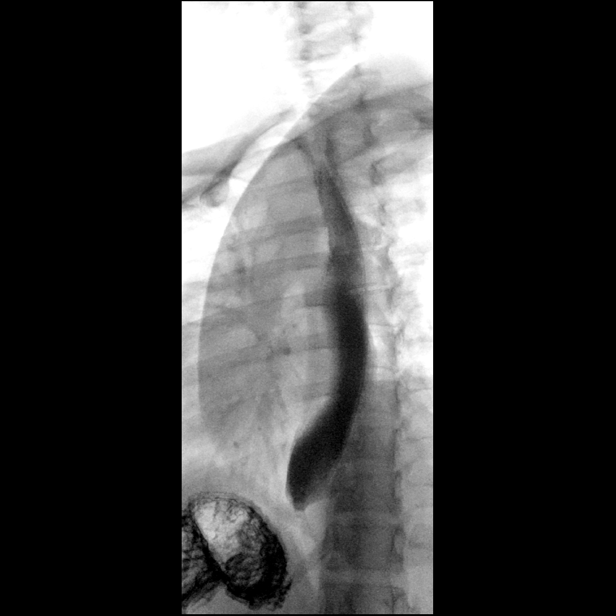
[frame 15/17]
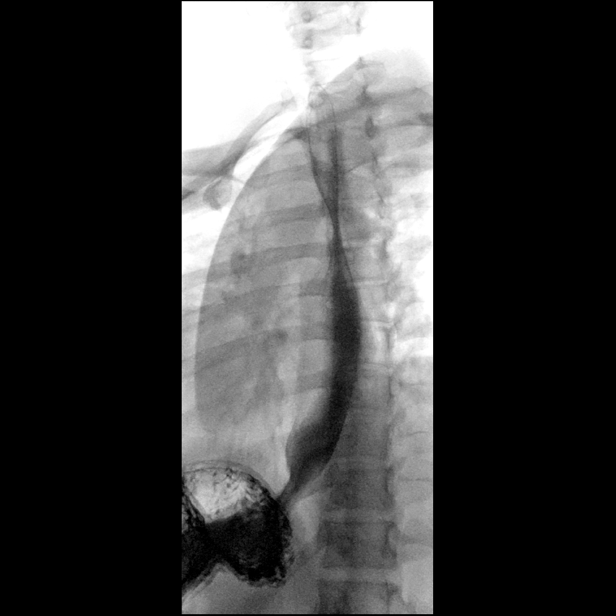

[Series 7: sequence · 2 of 13 frames shown (4 of 6)]
[frame 1/13]
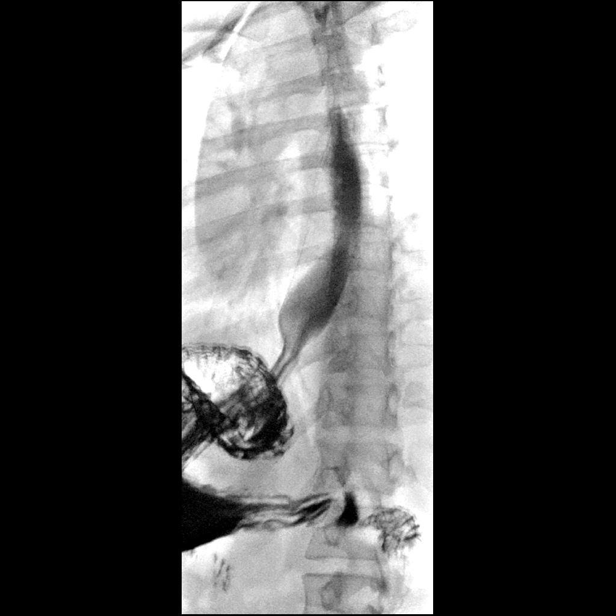
[frame 12/13]
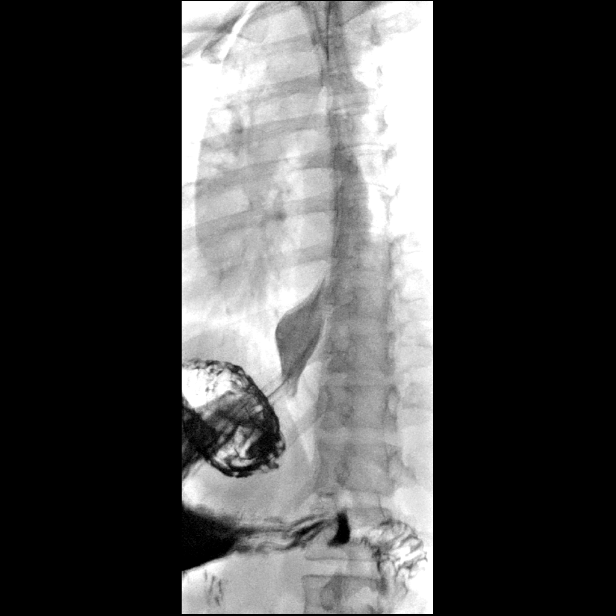

[Series 8: sequence · 1 of 26 frames shown (5 of 6)]
[frame 14/26]
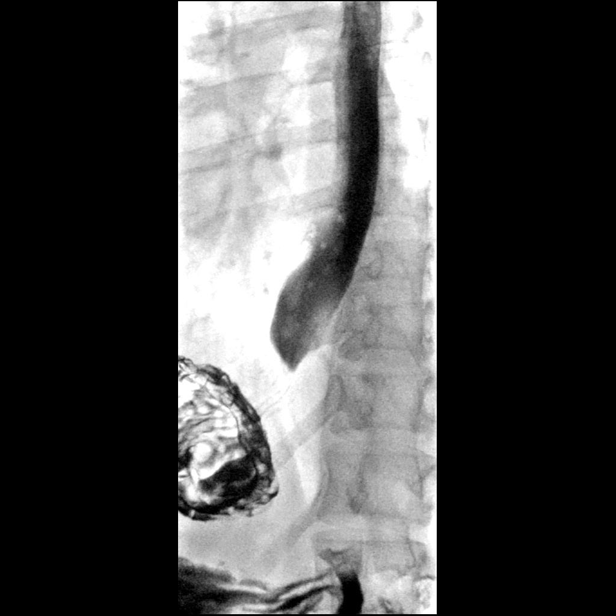

[Series 9: sequence · 2 of 16 frames shown (6 of 6)]
[frame 8/16]
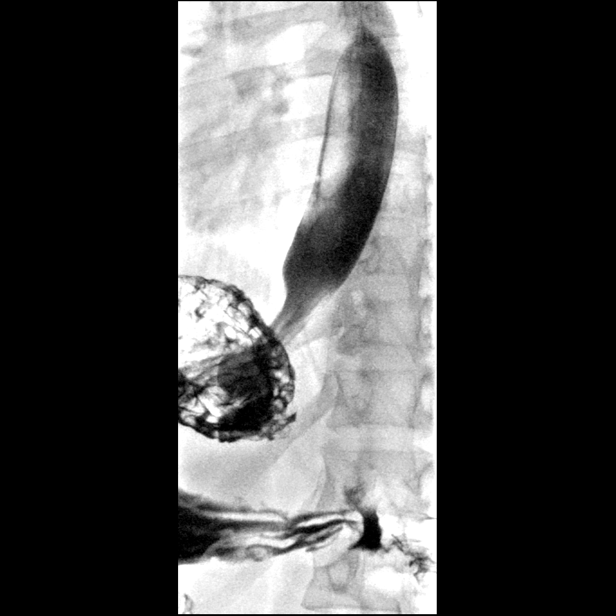
[frame 9/16]
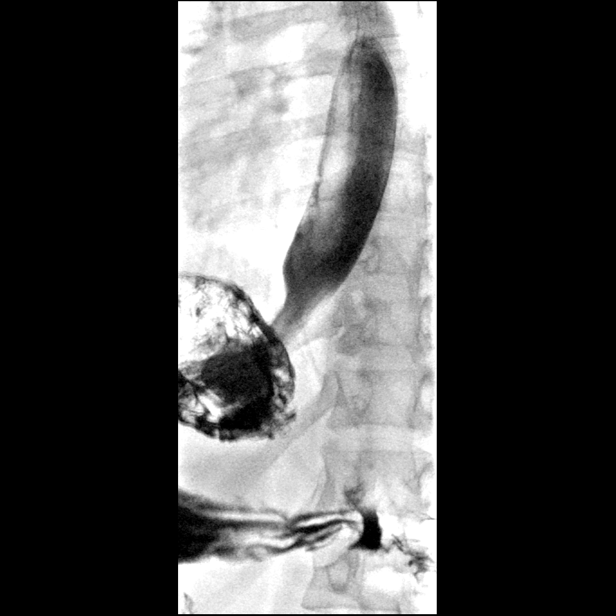

[Series 10: one shot · 2 of 5 slices shown (3 of 3)]
[im 2/5]
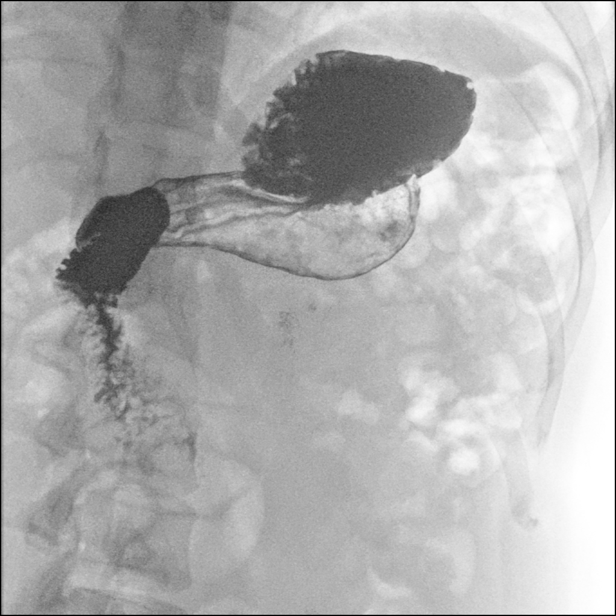
[im 5/5]
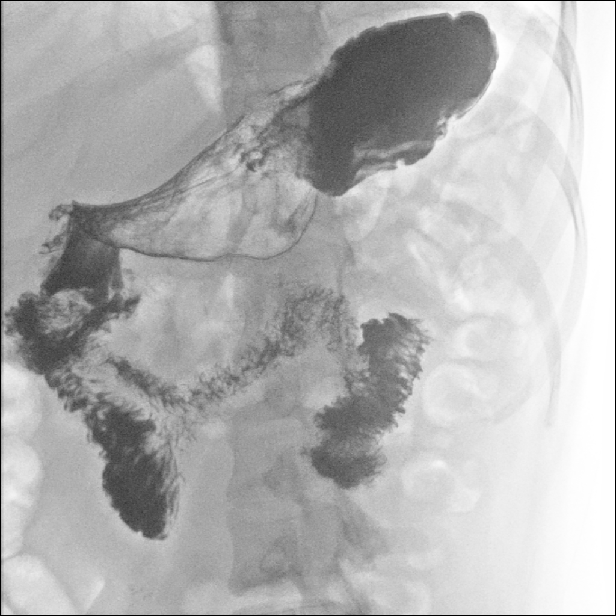

[14 of 24 positions shown; findings below may reference images not displayed]

FINDINGS: Initial barium swallows demonstrate normal esophageal motility. No
intrinsic or extrinsic lesions of the esophagus were identified. No
mucosal abnormalities.

There is a small sliding-type hiatal hernia but no demonstrable GE
reflux. No distal mass or stricture.

The stomach, duodenum bulb and C-loop appear normal. No mucosal
abnormalities or ulcers. The duodenal jejunal junction is in its
normal anatomic location.
IMPRESSION: 1. Small sliding-type hiatal hernia.
2. No demonstrable GE reflux.
3. Normal esophageal motility.
4. Normal stomach and duodenum.

## 2019-03-06 MED ORDER — ONDANSETRON HCL 4 MG PO TABS: 4.0000 mg | ORAL_TABLET | Freq: Three times a day (TID) | ORAL | 0 refills | Status: DC | PRN

## 2019-03-06 NOTE — Assessment & Plan Note (Signed)
Rx for zofran and suspect nausea could be coming from OCPs.

## 2019-03-06 NOTE — Assessment & Plan Note (Signed)
Letter given for emotional support animal and continue counseling as needed. Talked about other coping skills.

## 2019-03-06 NOTE — Progress Notes (Signed)
   Subjective:   Patient ID: Catherine Freeman, female    DOB: 12/27/1990, 28 y.o.   MRN: [id number]  HPI The patient is a 28 YO female coming in for anxiety (has a dog which helps with her anxiety and uses other natural methods to help, seeing counselor as well, denies SI/HI, well controlled, need note for support dog to waive fee for moving), and nausea (associated with menstrual cycles, changed birth control about 1 year ago, denies vomiting, denies abdominal pain, denies constipation or diarrhea).   Review of Systems  Constitutional: Negative.   HENT: Negative.   Eyes: Negative.   Respiratory: Negative for cough, chest tightness and shortness of breath.   Cardiovascular: Negative for chest pain, palpitations and leg swelling.  Gastrointestinal: Negative for abdominal distention, abdominal pain, constipation, diarrhea, nausea and vomiting.  Musculoskeletal: Negative.   Skin: Negative.   Neurological: Negative.   Psychiatric/Behavioral: Negative.     Objective:  Physical Exam Constitutional:      Appearance: She is well-developed.  HENT:     Head: Normocephalic and atraumatic.  Neck:     Musculoskeletal: Normal range of motion.  Cardiovascular:     Rate and Rhythm: Normal rate and regular rhythm.  Pulmonary:     Effort: Pulmonary effort is normal. No respiratory distress.     Breath sounds: Normal breath sounds. No wheezing or rales.  Abdominal:     General: Bowel sounds are normal. There is no distension.     Palpations: Abdomen is soft.     Tenderness: There is no abdominal tenderness. There is no rebound.  Skin:    General: Skin is warm and dry.  Neurological:     Mental Status: She is alert and oriented to person, place, and time.     Coordination: Coordination normal.     Vitals:   03/06/19 1330  BP: 120/80  Pulse: 90  Temp: 98.1 F (36.7 C)  TempSrc: Oral  SpO2: 98%  Weight: 172 lb (78 kg)  Height: 5\' 3"  (1.6 m)    Assessment & Plan:

## 2019-03-06 NOTE — Patient Instructions (Signed)
We have sent in zofran for the nausea and this could be related to the birth control pills.

## 2019-03-10 ENCOUNTER — Ambulatory Visit: Payer: 59 | Admitting: Internal Medicine

## 2019-04-24 ENCOUNTER — Encounter: Payer: Self-pay | Admitting: Internal Medicine

## 2019-04-29 ENCOUNTER — Encounter: Payer: Self-pay | Admitting: Internal Medicine

## 2019-05-25 ENCOUNTER — Encounter: Payer: Self-pay | Admitting: Internal Medicine

## 2019-05-25 MED ORDER — BUSPIRONE HCL 5 MG PO TABS: 5.0000 mg | ORAL_TABLET | Freq: Two times a day (BID) | ORAL | 3 refills | Status: DC | PRN

## 2019-06-12 ENCOUNTER — Encounter: Payer: 59 | Admitting: Internal Medicine

## 2019-06-16 ENCOUNTER — Encounter: Payer: Self-pay | Admitting: Internal Medicine

## 2019-06-26 ENCOUNTER — Encounter: Payer: 59 | Admitting: Internal Medicine

## 2019-06-30 ENCOUNTER — Encounter: Payer: Self-pay | Admitting: Internal Medicine

## 2019-07-01 ENCOUNTER — Ambulatory Visit: Payer: Self-pay | Admitting: *Deleted

## 2019-07-01 DIAGNOSIS — Z20822 Contact with and (suspected) exposure to covid-19: Secondary | ICD-10-CM

## 2019-07-01 NOTE — Telephone Encounter (Signed)
Pt calling in requesting to be tested for COVID-19.    I let her know starting today she does not need an appt to be tested.   She can go to the location at the [facility].   I made her aware to stay in the car and wear a mask.  I entered the order.

## 2019-08-07 ENCOUNTER — Encounter: Payer: 59 | Admitting: Internal Medicine

## 2019-08-14 ENCOUNTER — Ambulatory Visit (INDEPENDENT_AMBULATORY_CARE_PROVIDER_SITE_OTHER): Payer: 59 | Admitting: Internal Medicine

## 2019-08-14 ENCOUNTER — Encounter: Payer: Self-pay | Admitting: Internal Medicine

## 2019-08-14 ENCOUNTER — Other Ambulatory Visit: Payer: Self-pay

## 2019-08-14 VITALS — BP 104/62 | HR 67 | Temp 98.7°F | Ht 63.0 in | Wt 178.0 lb

## 2019-08-14 DIAGNOSIS — Z0000 Encounter for general adult medical examination without abnormal findings: Secondary | ICD-10-CM

## 2019-08-14 DIAGNOSIS — K219 Gastro-esophageal reflux disease without esophagitis: Secondary | ICD-10-CM | POA: Diagnosis not present

## 2019-08-14 DIAGNOSIS — F4010 Social phobia, unspecified: Secondary | ICD-10-CM | POA: Diagnosis not present

## 2019-08-14 NOTE — Assessment & Plan Note (Signed)
Flu shot declines. Tetanus up to date. pap smear up to date with gyn. Counseled about sun safety and mole surveillance. Counseled about the dangers of distracted driving. Given 10 year screening recommendations.

## 2019-08-14 NOTE — Patient Instructions (Signed)
Health Maintenance, Female Adopting a healthy lifestyle and getting preventive care are important in promoting health and wellness. Ask your health care provider about:  The right schedule for you to have regular tests and exams.  Things you can do on your own to prevent diseases and keep yourself healthy. What should I know about diet, weight, and exercise? Eat a healthy diet   Eat a diet that includes plenty of vegetables, fruits, low-fat dairy products, and lean protein.  Do not eat a lot of foods that are high in solid fats, added sugars, or sodium. Maintain a healthy weight Body mass index (BMI) is used to identify weight problems. It estimates body fat based on height and weight. Your health care provider can help determine your BMI and help you achieve or maintain a healthy weight. Get regular exercise Get regular exercise. This is one of the most important things you can do for your health. Most adults should:  Exercise for at least 150 minutes each week. The exercise should increase your heart rate and make you sweat (moderate-intensity exercise).  Do strengthening exercises at least twice a week. This is in addition to the moderate-intensity exercise.  Spend less time sitting. Even light physical activity can be beneficial. Watch cholesterol and blood lipids Have your blood tested for lipids and cholesterol at 29 years of age, then have this test every 5 years. Have your cholesterol levels checked more often if:  Your lipid or cholesterol levels are high.  You are older than 29 years of age.  You are at high risk for heart disease. What should I know about cancer screening? Depending on your health history and family history, you may need to have cancer screening at various ages. This may include screening for:  Breast cancer.  Cervical cancer.  Colorectal cancer.  Skin cancer.  Lung cancer. What should I know about heart disease, diabetes, and high blood  pressure? Blood pressure and heart disease  High blood pressure causes heart disease and increases the risk of stroke. This is more likely to develop in people who have high blood pressure readings, are of African descent, or are overweight.  Have your blood pressure checked: ? Every 3-5 years if you are 18-39 years of age. ? Every year if you are 40 years old or older. Diabetes Have regular diabetes screenings. This checks your fasting blood sugar level. Have the screening done:  Once every three years after age 40 if you are at a normal weight and have a low risk for diabetes.  More often and at a younger age if you are overweight or have a high risk for diabetes. What should I know about preventing infection? Hepatitis B If you have a higher risk for hepatitis B, you should be screened for this virus. Talk with your health care provider to find out if you are at risk for hepatitis B infection. Hepatitis C Testing is recommended for:  Everyone born from 1945 through 1965.  Anyone with known risk factors for hepatitis C. Sexually transmitted infections (STIs)  Get screened for STIs, including gonorrhea and chlamydia, if: ? You are sexually active and are younger than 29 years of age. ? You are older than 29 years of age and your health care provider tells you that you are at risk for this type of infection. ? Your sexual activity has changed since you were last screened, and you are at increased risk for chlamydia or gonorrhea. Ask your health care provider if   you are at risk.  Ask your health care provider about whether you are at high risk for HIV. Your health care provider may recommend a prescription medicine to help prevent HIV infection. If you choose to take medicine to prevent HIV, you should first get tested for HIV. You should then be tested every 3 months for as long as you are taking the medicine. Pregnancy  If you are about to stop having your period (premenopausal) and  you may become pregnant, seek counseling before you get pregnant.  Take 400 to 800 micrograms (mcg) of folic acid every day if you become pregnant.  Ask for birth control (contraception) if you want to prevent pregnancy. Osteoporosis and menopause Osteoporosis is a disease in which the bones lose minerals and strength with aging. This can result in bone fractures. If you are 65 years old or older, or if you are at risk for osteoporosis and fractures, ask your health care provider if you should:  Be screened for bone loss.  Take a calcium or vitamin D supplement to lower your risk of fractures.  Be given hormone replacement therapy (HRT) to treat symptoms of menopause. Follow these instructions at home: Lifestyle  Do not use any products that contain nicotine or tobacco, such as cigarettes, e-cigarettes, and chewing tobacco. If you need help quitting, ask your health care provider.  Do not use street drugs.  Do not share needles.  Ask your health care provider for help if you need support or information about quitting drugs. Alcohol use  Do not drink alcohol if: ? Your health care provider tells you not to drink. ? You are pregnant, may be pregnant, or are planning to become pregnant.  If you drink alcohol: ? Limit how much you use to 0-1 drink a day. ? Limit intake if you are breastfeeding.  Be aware of how much alcohol is in your drink. In the [place] one drink equals one 12 oz bottle of beer (355 mL), one 5 oz glass of wine (148 mL), or one 1 oz glass of hard liquor (44 mL). General instructions  Schedule regular health, dental, and eye exams.  Stay current with your vaccines.  Tell your health care provider if: ? You often feel depressed. ? You have ever been abused or do not feel safe at home. Summary  Adopting a healthy lifestyle and getting preventive care are important in promoting health and wellness.  Follow your health care provider's instructions about healthy  diet, exercising, and getting tested or screened for diseases.  Follow your health care provider's instructions on monitoring your cholesterol and blood pressure. This information is not intended to replace advice given to you by your health care provider. Make sure you discuss any questions you have with your health care provider. Document Released: 06/18/2011 Document Revised: 11/26/2018 Document Reviewed: 11/26/2018 Elsevier Patient Education  2020 [employer]  

## 2019-08-14 NOTE — Assessment & Plan Note (Signed)
Stable, using buspar prn.

## 2019-08-14 NOTE — Progress Notes (Signed)
   Subjective:   Patient ID: Catherine Freeman, female    DOB: Oct 12, 1991, 28 y.o.   MRN: [id number]  HPI The patient is a 28 YO female coming in for physical.   PMH, [facility], social history reviewed and updated.   Review of Systems  Constitutional: Negative.   HENT: Negative.   Eyes: Negative.   Respiratory: Negative for cough, chest tightness and shortness of breath.   Cardiovascular: Negative for chest pain, palpitations and leg swelling.  Gastrointestinal: Negative for abdominal distention, abdominal pain, constipation, diarrhea, nausea and vomiting.  Musculoskeletal: Negative.   Skin: Negative.   Neurological: Negative.   Psychiatric/Behavioral: Negative.     Objective:  Physical Exam Constitutional:      Appearance: She is well-developed.  HENT:     Head: Normocephalic and atraumatic.  Neck:     Musculoskeletal: Normal range of motion.  Cardiovascular:     Rate and Rhythm: Normal rate and regular rhythm.  Pulmonary:     Effort: Pulmonary effort is normal. No respiratory distress.     Breath sounds: Normal breath sounds. No wheezing or rales.  Abdominal:     General: Bowel sounds are normal. There is no distension.     Palpations: Abdomen is soft.     Tenderness: There is no abdominal tenderness. There is no rebound.  Skin:    General: Skin is warm and dry.  Neurological:     Mental Status: She is alert and oriented to person, place, and time.     Coordination: Coordination normal.     Vitals:   08/14/19 1005  BP: 104/62  Pulse: 67  Temp: 98.7 F (37.1 C)  TempSrc: Oral  SpO2: 98%  Weight: 178 lb (80.7 kg)  Height: 5\' 3"  (1.6 m)    Assessment & Plan:

## 2019-08-14 NOTE — Assessment & Plan Note (Signed)
Doing well with protonix.  

## 2019-10-23 ENCOUNTER — Encounter: Payer: Self-pay | Admitting: Internal Medicine

## 2019-10-23 MED ORDER — BUSPIRONE HCL 10 MG PO TABS: 10.0000 mg | ORAL_TABLET | Freq: Two times a day (BID) | ORAL | 3 refills | Status: DC | PRN

## 2019-11-10 ENCOUNTER — Other Ambulatory Visit: Payer: Self-pay | Admitting: Gastroenterology

## 2019-11-10 DIAGNOSIS — R110 Nausea: Secondary | ICD-10-CM

## 2019-11-20 ENCOUNTER — Ambulatory Visit
Admission: RE | Admit: 2019-11-20 | Discharge: 2019-11-20 | Disposition: A | Discharge status: Home / Self Care | Payer: 59 | Source: Ambulatory Visit | Attending: Gastroenterology | Admitting: Gastroenterology

## 2019-11-20 DIAGNOSIS — R110 Nausea: Secondary | ICD-10-CM

## 2019-11-25 ENCOUNTER — Other Ambulatory Visit: Payer: Self-pay | Admitting: Gastroenterology

## 2019-11-25 ENCOUNTER — Other Ambulatory Visit (HOSPITAL_COMMUNITY): Payer: Self-pay | Admitting: Gastroenterology

## 2019-11-25 DIAGNOSIS — K8020 Calculus of gallbladder without cholecystitis without obstruction: Secondary | ICD-10-CM

## 2019-11-25 DIAGNOSIS — R110 Nausea: Secondary | ICD-10-CM

## 2019-11-26 ENCOUNTER — Telehealth: Payer: Self-pay | Admitting: *Deleted

## 2019-12-02 ENCOUNTER — Other Ambulatory Visit: Payer: Self-pay

## 2019-12-02 ENCOUNTER — Encounter (HOSPITAL_COMMUNITY)
Admission: RE | Admit: 2019-12-02 | Discharge: 2019-12-02 | Disposition: A | Discharge status: Home / Self Care | Payer: 59 | Source: Ambulatory Visit | Attending: Gastroenterology | Admitting: Gastroenterology

## 2019-12-02 DIAGNOSIS — K8020 Calculus of gallbladder without cholecystitis without obstruction: Secondary | ICD-10-CM | POA: Diagnosis present

## 2019-12-02 DIAGNOSIS — R110 Nausea: Secondary | ICD-10-CM

## 2019-12-02 MED ORDER — TECHNETIUM TC 99M MEBROFENIN IV KIT
5.2000 | PACK | Freq: Once | INTRAVENOUS | Status: AC | PRN
  Administered 2019-12-02: 5.2 via INTRAVENOUS

## 2019-12-08 ENCOUNTER — Other Ambulatory Visit: Payer: 59

## 2019-12-08 ENCOUNTER — Encounter: Payer: Self-pay | Admitting: Internal Medicine

## 2020-01-05 ENCOUNTER — Encounter: Payer: Self-pay | Admitting: Internal Medicine

## 2020-01-08 ENCOUNTER — Other Ambulatory Visit: Payer: Self-pay | Admitting: Internal Medicine

## 2020-01-25 ENCOUNTER — Ambulatory Visit: Payer: 59 | Attending: Internal Medicine

## 2020-01-25 DIAGNOSIS — Z20822 Contact with and (suspected) exposure to covid-19: Secondary | ICD-10-CM

## 2020-01-26 LAB — NOVEL CORONAVIRUS, NAA: SARS-CoV-2, NAA: NOT DETECTED

## 2020-02-04 ENCOUNTER — Ambulatory Visit: Payer: 59 | Admitting: Internal Medicine

## 2020-03-27 ENCOUNTER — Encounter: Payer: Self-pay | Admitting: Internal Medicine

## 2020-03-28 ENCOUNTER — Telehealth: Payer: Self-pay | Admitting: Internal Medicine

## 2020-03-28 NOTE — Telephone Encounter (Signed)
New message:   Called patient to schedule appt after getting a Clinical cytogeneticist message from assistant.

## 2020-03-29 ENCOUNTER — Encounter: Payer: Self-pay | Admitting: Internal Medicine

## 2020-03-29 ENCOUNTER — Other Ambulatory Visit: Payer: Self-pay

## 2020-03-29 ENCOUNTER — Ambulatory Visit: Payer: 59 | Admitting: Internal Medicine

## 2020-03-29 DIAGNOSIS — F4010 Social phobia, unspecified: Secondary | ICD-10-CM

## 2020-03-29 MED ORDER — ESCITALOPRAM OXALATE 10 MG PO TABS: 10.0000 mg | ORAL_TABLET | Freq: Every day | ORAL | 1 refills | Status: DC

## 2020-03-29 NOTE — Patient Instructions (Signed)
We have sent in the lexapro to take 1 pill daily.   You can stop taking this anytime without tapering off the medicine.

## 2020-03-29 NOTE — Progress Notes (Signed)
   Subjective:   Patient ID: Catherine Freeman, female    DOB: August 24, 1991, 29 y.o.   MRN: [id number]  HPI The patient is a 29 YO female coming in for concerns about anxiety. Has previously used buspar rarely and this does help some. The 5 mg does not help as much and 10 mg made her feel too spacy so she uses 5 mg and can take an extra half pill if needed to help. She is having more anxiety all the time lately with the pandemic and some other stress in her life. Still doing school as well as working and family. She is wanting something she can take for a little while to help her get this under control and not feel bad all the time. Denies SI/HI.   Review of Systems  Constitutional: Negative.   HENT: Negative.   Eyes: Negative.   Respiratory: Negative for cough, chest tightness and shortness of breath.   Cardiovascular: Negative for chest pain, palpitations and leg swelling.  Gastrointestinal: Negative for abdominal distention, abdominal pain, constipation, diarrhea, nausea and vomiting.  Musculoskeletal: Negative.   Skin: Negative.   Neurological: Negative.   Psychiatric/Behavioral: Positive for decreased concentration, dysphoric mood and sleep disturbance. The patient is nervous/anxious.     Objective:  Physical Exam Constitutional:      Appearance: She is well-developed.  HENT:     Head: Normocephalic and atraumatic.  Cardiovascular:     Rate and Rhythm: Normal rate and regular rhythm.  Pulmonary:     Effort: Pulmonary effort is normal. No respiratory distress.     Breath sounds: Normal breath sounds. No wheezing or rales.  Abdominal:     General: Bowel sounds are normal. There is no distension.     Palpations: Abdomen is soft.     Tenderness: There is no abdominal tenderness. There is no rebound.  Musculoskeletal:     Cervical back: Normal range of motion.  Skin:    General: Skin is warm and dry.  Neurological:     Mental Status: She is alert and oriented to person, place, and  time.     Coordination: Coordination normal.  Psychiatric:     Comments: Mood appropriate     Vitals:   03/29/20 1341  BP: 122/80  Pulse: 65  Temp: 98.6 F (37 C)  TempSrc: Oral  SpO2: 97%  Weight: 176 lb 4 oz (79.9 kg)  Height: 5\' 3"  (1.6 m)    This visit occurred during the SARS-CoV-2 public health emergency.  Safety protocols were in place, including screening questions prior to the visit, additional usage of staff PPE, and extensive cleaning of exam room while observing appropriate contact time as indicated for disinfecting solutions.   Assessment & Plan:  Visit time 25 minutes in face to face communication with patient and coordination of care, additional 5 minutes spent in record review, coordination or care, ordering tests, communicating/referring to other healthcare professionals, documenting in medical records all on the same day of the visit for total time 30 minutes spent on the visit.

## 2020-03-31 NOTE — Assessment & Plan Note (Signed)
Has progressed with stress and pandemic to all the time anxiety. Rx lexapro to use for short time to get her back on track. Talked about other stress relieving skills and strategies. Use buspar prn as well if needed.

## 2020-08-12 ENCOUNTER — Ambulatory Visit: Payer: 59 | Admitting: Internal Medicine

## 2020-08-21 ENCOUNTER — Other Ambulatory Visit: Payer: Self-pay | Admitting: Internal Medicine

## 2020-09-22 ENCOUNTER — Other Ambulatory Visit: Payer: Self-pay | Admitting: Internal Medicine

## 2020-10-31 ENCOUNTER — Other Ambulatory Visit: Payer: Self-pay

## 2020-10-31 ENCOUNTER — Ambulatory Visit (INDEPENDENT_AMBULATORY_CARE_PROVIDER_SITE_OTHER): Payer: 59 | Admitting: Internal Medicine

## 2020-10-31 ENCOUNTER — Encounter: Payer: Self-pay | Admitting: Internal Medicine

## 2020-10-31 VITALS — BP 119/78 | HR 80 | Temp 98.1°F | Ht 63.0 in | Wt 198.0 lb

## 2020-10-31 DIAGNOSIS — Z0000 Encounter for general adult medical examination without abnormal findings: Secondary | ICD-10-CM | POA: Diagnosis not present

## 2020-10-31 DIAGNOSIS — J3089 Other allergic rhinitis: Secondary | ICD-10-CM | POA: Diagnosis not present

## 2020-10-31 DIAGNOSIS — F4010 Social phobia, unspecified: Secondary | ICD-10-CM

## 2020-10-31 NOTE — Progress Notes (Signed)
   Subjective:   Patient ID: Catherine Freeman, female    DOB: [birth year], 29 y.o.   MRN: [id number]  HPI The patient is a 29 YO female coming in for physical.   PMH, [facility], social history reviewed and updated.   Review of Systems  Constitutional: Negative.   HENT: Negative.   Eyes: Negative.   Respiratory: Negative for cough, chest tightness and shortness of breath.   Cardiovascular: Negative for chest pain, palpitations and leg swelling.  Gastrointestinal: Negative for abdominal distention, abdominal pain, constipation, diarrhea, nausea and vomiting.  Musculoskeletal: Negative.   Skin: Negative.   Neurological: Negative.   Psychiatric/Behavioral: Negative.     Objective:  Physical Exam Constitutional:      Appearance: She is well-developed. She is obese.  HENT:     Head: Normocephalic and atraumatic.  Cardiovascular:     Rate and Rhythm: Normal rate and regular rhythm.  Pulmonary:     Effort: Pulmonary effort is normal. No respiratory distress.     Breath sounds: Normal breath sounds. No wheezing or rales.  Abdominal:     General: Bowel sounds are normal. There is no distension.     Palpations: Abdomen is soft.     Tenderness: There is no abdominal tenderness. There is no rebound.  Musculoskeletal:     Cervical back: Normal range of motion.  Skin:    General: Skin is warm and dry.  Neurological:     Mental Status: She is alert and oriented to person, place, and time.     Coordination: Coordination normal.     Vitals:   10/31/20 1402  BP: 119/78  Pulse: 80  Temp: 98.1 F (36.7 C)  TempSrc: Oral  SpO2: 97%  Weight: 198 lb (89.8 kg)  Height: 5\' 3"  (1.6 m)    This visit occurred during the SARS-CoV-2 public health emergency.  Safety protocols were in place, including screening questions prior to the visit, additional usage of staff PPE, and extensive cleaning of exam room while observing appropriate contact time as indicated for disinfecting solutions.   Assessment &  Plan:

## 2020-10-31 NOTE — Patient Instructions (Signed)
Health Maintenance, Female Adopting a healthy lifestyle and getting preventive care are important in promoting health and wellness. Ask your health care provider about:  The right schedule for you to have regular tests and exams.  Things you can do on your own to prevent diseases and keep yourself healthy. What should I know about diet, weight, and exercise? Eat a healthy diet   Eat a diet that includes plenty of vegetables, fruits, low-fat dairy products, and lean protein.  Do not eat a lot of foods that are high in solid fats, added sugars, or sodium. Maintain a healthy weight Body mass index (BMI) is used to identify weight problems. It estimates body fat based on height and weight. Your health care provider can help determine your BMI and help you achieve or maintain a healthy weight. Get regular exercise Get regular exercise. This is one of the most important things you can do for your health. Most adults should:  Exercise for at least 150 minutes each week. The exercise should increase your heart rate and make you sweat (moderate-intensity exercise).  Do strengthening exercises at least twice a week. This is in addition to the moderate-intensity exercise.  Spend less time sitting. Even light physical activity can be beneficial. Watch cholesterol and blood lipids Have your blood tested for lipids and cholesterol at 30 years of age, then have this test every 5 years. Have your cholesterol levels checked more often if:  Your lipid or cholesterol levels are high.  You are older than 30 years of age.  You are at high risk for heart disease. What should I know about cancer screening? Depending on your health history and family history, you may need to have cancer screening at various ages. This may include screening for:  Breast cancer.  Cervical cancer.  Colorectal cancer.  Skin cancer.  Lung cancer. What should I know about heart disease, diabetes, and high blood  pressure? Blood pressure and heart disease  High blood pressure causes heart disease and increases the risk of stroke. This is more likely to develop in people who have high blood pressure readings, are of African descent, or are overweight.  Have your blood pressure checked: ? Every 3-5 years if you are 18-39 years of age. ? Every year if you are 40 years old or older. Diabetes Have regular diabetes screenings. This checks your fasting blood sugar level. Have the screening done:  Once every three years after age 40 if you are at a normal weight and have a low risk for diabetes.  More often and at a younger age if you are overweight or have a high risk for diabetes. What should I know about preventing infection? Hepatitis B If you have a higher risk for hepatitis B, you should be screened for this virus. Talk with your health care provider to find out if you are at risk for hepatitis B infection. Hepatitis C Testing is recommended for:  Everyone born from 1945 through 1965.  Anyone with known risk factors for hepatitis C. Sexually transmitted infections (STIs)  Get screened for STIs, including gonorrhea and chlamydia, if: ? You are sexually active and are younger than 30 years of age. ? You are older than 30 years of age and your health care provider tells you that you are at risk for this type of infection. ? Your sexual activity has changed since you were last screened, and you are at increased risk for chlamydia or gonorrhea. Ask your health care provider if   you are at risk.  Ask your health care provider about whether you are at high risk for HIV. Your health care provider may recommend a prescription medicine to help prevent HIV infection. If you choose to take medicine to prevent HIV, you should first get tested for HIV. You should then be tested every 3 months for as long as you are taking the medicine. Pregnancy  If you are about to stop having your period (premenopausal) and  you may become pregnant, seek counseling before you get pregnant.  Take 400 to 800 micrograms (mcg) of folic acid every day if you become pregnant.  Ask for birth control (contraception) if you want to prevent pregnancy. Osteoporosis and menopause Osteoporosis is a disease in which the bones lose minerals and strength with aging. This can result in bone fractures. If you are 65 years old or older, or if you are at risk for osteoporosis and fractures, ask your health care provider if you should:  Be screened for bone loss.  Take a calcium or vitamin D supplement to lower your risk of fractures.  Be given hormone replacement therapy (HRT) to treat symptoms of menopause. Follow these instructions at home: Lifestyle  Do not use any products that contain nicotine or tobacco, such as cigarettes, e-cigarettes, and chewing tobacco. If you need help quitting, ask your health care provider.  Do not use street drugs.  Do not share needles.  Ask your health care provider for help if you need support or information about quitting drugs. Alcohol use  Do not drink alcohol if: ? Your health care provider tells you not to drink. ? You are pregnant, may be pregnant, or are planning to become pregnant.  If you drink alcohol: ? Limit how much you use to 0-1 drink a day. ? Limit intake if you are breastfeeding.  Be aware of how much alcohol is in your drink. In the [place] one drink equals one 12 oz bottle of beer (355 mL), one 5 oz glass of wine (148 mL), or one 1 oz glass of hard liquor (44 mL). General instructions  Schedule regular health, dental, and eye exams.  Stay current with your vaccines.  Tell your health care provider if: ? You often feel depressed. ? You have ever been abused or do not feel safe at home. Summary  Adopting a healthy lifestyle and getting preventive care are important in promoting health and wellness.  Follow your health care provider's instructions about healthy  diet, exercising, and getting tested or screened for diseases.  Follow your health care provider's instructions on monitoring your cholesterol and blood pressure. This information is not intended to replace advice given to you by your health care provider. Make sure you discuss any questions you have with your health care provider. Document Revised: 11/26/2018 Document Reviewed: 11/26/2018 Elsevier Patient Education  2020 [employer]vier Inc.  

## 2020-11-01 NOTE — Assessment & Plan Note (Signed)
Overall slightly improved due to less stressful work environment.

## 2020-11-01 NOTE — Assessment & Plan Note (Signed)
Flu shot up to date. Covid-19 up to date recommended booster shot. Tetanus up to date. Pap smear up to date. Counseled about sun safety and mole surveillance. Counseled about the dangers of distracted driving. Given 10 year screening recommendations.

## 2020-11-01 NOTE — Assessment & Plan Note (Signed)
Getting repeat allergy testing soon but declines allergy shots.

## 2020-11-26 ENCOUNTER — Other Ambulatory Visit: Payer: Self-pay | Admitting: Internal Medicine

## 2020-12-24 ENCOUNTER — Encounter: Payer: Self-pay | Admitting: Internal Medicine

## 2021-04-10 ENCOUNTER — Other Ambulatory Visit: Payer: Self-pay

## 2021-04-10 ENCOUNTER — Encounter: Payer: Self-pay | Admitting: Internal Medicine

## 2021-04-10 ENCOUNTER — Ambulatory Visit: Payer: 59 | Admitting: Internal Medicine

## 2021-04-10 VITALS — BP 122/70 | HR 70 | Temp 98.9°F | Resp 18 | Ht 63.0 in | Wt 200.6 lb

## 2021-04-10 DIAGNOSIS — Z0000 Encounter for general adult medical examination without abnormal findings: Secondary | ICD-10-CM

## 2021-04-10 DIAGNOSIS — E669 Obesity, unspecified: Secondary | ICD-10-CM

## 2021-04-10 LAB — HEMOGLOBIN A1C: Hgb A1c MFr Bld: 6.2 % (ref 4.6–6.5)

## 2021-04-10 LAB — CBC
HCT: 38.9 % (ref 36.0–46.0)
Hemoglobin: 13.1 g/dL (ref 12.0–15.0)
MCHC: 33.7 g/dL (ref 30.0–36.0)
MCV: 86 fl (ref 78.0–100.0)
Platelets: 311 10*3/uL (ref 150.0–400.0)
RBC: 4.53 Mil/uL (ref 3.87–5.11)
RDW: 14.2 % (ref 11.5–15.5)
WBC: 10.9 10*3/uL — ABNORMAL HIGH (ref 4.0–10.5)

## 2021-04-10 LAB — VITAMIN D 25 HYDROXY (VIT D DEFICIENCY, FRACTURES): VITD: 31.46 ng/mL (ref 30.00–100.00)

## 2021-04-10 LAB — VITAMIN B12: Vitamin B-12: 593 pg/mL (ref 211–911)

## 2021-04-10 LAB — TSH: TSH: 0.81 u[IU]/mL (ref 0.35–4.50)

## 2021-04-10 NOTE — Progress Notes (Signed)
   Subjective:   Patient ID: Catherine Freeman, female    DOB: September 01, 1991, 30 y.o.   MRN: [id number]  HPI The patient is a 30 YO female coming in for concerns about not losing weight. She feels like since turning 29 she is struggling to lose weight as easily and now is trying to avoid gaining. She denies excessive fatigue, heat or cold intolerance. Denies chills or fevers.   Review of Systems  Constitutional: Negative.   HENT: Negative.   Eyes: Negative.   Respiratory: Negative for cough, chest tightness and shortness of breath.   Cardiovascular: Negative for chest pain, palpitations and leg swelling.  Gastrointestinal: Negative for abdominal distention, abdominal pain, constipation, diarrhea, nausea and vomiting.  Musculoskeletal: Negative.   Skin: Negative.   Neurological: Negative.   Psychiatric/Behavioral: Negative.     Objective:  Physical Exam Constitutional:      Appearance: She is well-developed. She is obese.  HENT:     Head: Normocephalic and atraumatic.  Cardiovascular:     Rate and Rhythm: Normal rate and regular rhythm.  Pulmonary:     Effort: Pulmonary effort is normal. No respiratory distress.     Breath sounds: Normal breath sounds. No wheezing or rales.  Abdominal:     General: Bowel sounds are normal. There is no distension.     Palpations: Abdomen is soft.     Tenderness: There is no abdominal tenderness. There is no rebound.  Musculoskeletal:     Cervical back: Normal range of motion.  Skin:    General: Skin is warm and dry.  Neurological:     Mental Status: She is alert and oriented to person, place, and time.     Coordination: Coordination normal.     Vitals:   04/10/21 1322  BP: 122/70  Pulse: 70  Resp: 18  Temp: 98.9 F (37.2 C)  TempSrc: Oral  SpO2: 98%  Weight: 200 lb 9.6 oz (91 kg)  Height: 5\' 3"  (1.6 m)    This visit occurred during the SARS-CoV-2 public health emergency.  Safety protocols were in place, including screening questions  prior to the visit, additional usage of staff PPE, and extensive cleaning of exam room while observing appropriate contact time as indicated for disinfecting solutions.   Assessment & Plan:  Visit time 20 minutes in face to face communication with patient and coordination of care, additional 2 minutes spent in record review, coordination or care, ordering tests, communicating/referring to other healthcare professionals, documenting in medical records all on the same day of the visit for total time 22 minutes spent on the visit.

## 2021-04-10 NOTE — Patient Instructions (Signed)
We will check the labs and then get back in touch with you.

## 2021-04-11 LAB — COMPREHENSIVE METABOLIC PANEL
ALT: 28 U/L (ref 0–35)
AST: 22 U/L (ref 0–37)
Albumin: 3.8 g/dL (ref 3.5–5.2)
Alkaline Phosphatase: 94 U/L (ref 39–117)
BUN: 9 mg/dL (ref 6–23)
CO2: 24 mEq/L (ref 19–32)
Calcium: 9.3 mg/dL (ref 8.4–10.5)
Chloride: 104 mEq/L (ref 96–112)
Creatinine, Ser: 0.65 mg/dL (ref 0.40–1.20)
GFR: 117.74 mL/min (ref >60.00)
Glucose, Bld: 75 mg/dL (ref 70–99)
Potassium: 3.9 mEq/L (ref 3.5–5.1)
Sodium: 138 mEq/L (ref 135–145)
Total Bilirubin: 0.4 mg/dL (ref 0.2–1.2)
Total Protein: 7.3 g/dL (ref 6.0–8.3)

## 2021-04-11 LAB — LIPID PANEL
Cholesterol: 137 mg/dL (ref 0–200)
HDL: 35.5 mg/dL — ABNORMAL LOW (ref >39.00)
LDL Cholesterol: 82 mg/dL (ref 0–99)
NonHDL: 101.49
Total CHOL/HDL Ratio: 4
Triglycerides: 96 mg/dL (ref 0.0–149.0)
VLDL: 19.2 mg/dL (ref 0.0–40.0)

## 2021-04-13 DIAGNOSIS — E669 Obesity, unspecified: Secondary | ICD-10-CM | POA: Insufficient documentation

## 2021-04-13 NOTE — Assessment & Plan Note (Signed)
Screening for metabolic cause of lack of weight loss with dieting including thyroid, HgA1c, B12, vitamin D, CBC, CMP. Adjust as appropriate.

## 2021-04-17 ENCOUNTER — Encounter: Payer: Self-pay | Admitting: Internal Medicine

## 2021-05-20 ENCOUNTER — Other Ambulatory Visit: Payer: Self-pay | Admitting: Internal Medicine

## 2021-11-03 ENCOUNTER — Ambulatory Visit (INDEPENDENT_AMBULATORY_CARE_PROVIDER_SITE_OTHER): Payer: 59 | Admitting: Internal Medicine

## 2021-11-03 ENCOUNTER — Encounter: Payer: Self-pay | Admitting: Internal Medicine

## 2021-11-03 ENCOUNTER — Other Ambulatory Visit: Payer: Self-pay

## 2021-11-03 VITALS — BP 118/72 | HR 90 | Resp 18 | Ht 63.0 in | Wt 206.4 lb

## 2021-11-03 DIAGNOSIS — R7303 Prediabetes: Secondary | ICD-10-CM

## 2021-11-03 DIAGNOSIS — E669 Obesity, unspecified: Secondary | ICD-10-CM | POA: Diagnosis not present

## 2021-11-03 DIAGNOSIS — F4010 Social phobia, unspecified: Secondary | ICD-10-CM

## 2021-11-03 DIAGNOSIS — Z0000 Encounter for general adult medical examination without abnormal findings: Secondary | ICD-10-CM

## 2021-11-03 LAB — POCT GLYCOSYLATED HEMOGLOBIN (HGB A1C): Hemoglobin A1C: 5.8 % — AB (ref 4.0–5.6)

## 2021-11-03 NOTE — Progress Notes (Signed)
   Subjective:   Patient ID: Catherine Freeman, female    DOB: 04/19/1991, 30 y.o.   MRN: [id number]  HPI The patient is a 30 YO female coming in for physical.   PMH, FMH, social history reviewed and updated  Review of Systems  Constitutional: Negative.   HENT: Negative.    Eyes: Negative.   Respiratory:  Negative for cough, chest tightness and shortness of breath.   Cardiovascular:  Negative for chest pain, palpitations and leg swelling.  Gastrointestinal:  Negative for abdominal distention, abdominal pain, constipation, diarrhea, nausea and vomiting.  Musculoskeletal: Negative.   Skin: Negative.   Neurological: Negative.   Psychiatric/Behavioral: Negative.     Objective:  Physical Exam Constitutional:      Appearance: She is well-developed. She is obese.  HENT:     Head: Normocephalic and atraumatic.  Cardiovascular:     Rate and Rhythm: Normal rate and regular rhythm.  Pulmonary:     Effort: Pulmonary effort is normal. No respiratory distress.     Breath sounds: Normal breath sounds. No wheezing or rales.  Abdominal:     General: Bowel sounds are normal. There is no distension.     Palpations: Abdomen is soft.     Tenderness: There is no abdominal tenderness. There is no rebound.  Musculoskeletal:     Cervical back: Normal range of motion.  Skin:    General: Skin is warm and dry.  Neurological:     Mental Status: She is alert and oriented to person, place, and time.     Coordination: Coordination normal.    Vitals:   11/03/21 1442  BP: 118/72  Pulse: 90  Resp: 18  SpO2: 98%  Weight: 206 lb 6.4 oz (93.6 kg)  Height: 5\' 3"  (1.6 m)    This visit occurred during the SARS-CoV-2 public health emergency.  Safety protocols were in place, including screening questions prior to the visit, additional usage of staff PPE, and extensive cleaning of exam room while observing appropriate contact time as indicated for disinfecting solutions.   Assessment & Plan:

## 2021-11-04 DIAGNOSIS — R7303 Prediabetes: Secondary | ICD-10-CM | POA: Insufficient documentation

## 2021-11-04 NOTE — Assessment & Plan Note (Signed)
Working on exercise and diet to help.

## 2021-11-04 NOTE — Assessment & Plan Note (Signed)
POC HgA1c done and 5.8 which is improved with her dietary changes. Advised to continue with those changes.

## 2021-11-04 NOTE — Assessment & Plan Note (Signed)
Flu shot up to date. Covid-19 declines booster. Tetanus up to date. Pap smear up to date. Counseled about sun safety and mole surveillance. Counseled about the dangers of distracted driving. Given 10 year screening recommendations.

## 2021-11-04 NOTE — Assessment & Plan Note (Signed)
Doing well with lexapro 10mg daily.  

## 2021-12-28 ENCOUNTER — Other Ambulatory Visit: Payer: Self-pay | Admitting: Internal Medicine

## 2022-01-28 ENCOUNTER — Encounter: Payer: Self-pay | Admitting: Internal Medicine

## 2022-02-13 ENCOUNTER — Ambulatory Visit: Payer: 59 | Admitting: Internal Medicine

## 2022-03-21 ENCOUNTER — Encounter: Payer: Self-pay | Admitting: Internal Medicine

## 2022-06-19 ENCOUNTER — Other Ambulatory Visit: Payer: Self-pay | Admitting: Internal Medicine

## 2022-07-25 ENCOUNTER — Encounter: Payer: Self-pay | Admitting: Internal Medicine

## 2022-10-29 ENCOUNTER — Encounter: Payer: 59 | Attending: Obstetrics and Gynecology | Admitting: Registered"

## 2022-10-29 DIAGNOSIS — E282 Polycystic ovarian syndrome: Secondary | ICD-10-CM | POA: Diagnosis not present

## 2022-10-29 NOTE — Patient Instructions (Addendum)
Consider using your Apple music to have to help you go to sleep https://www.sleepfoundation.org/ look for the 2-week program for better sleep Less TV before bed Magnesium can help with sleep, restless legs and there is some evidence can help with PMS type of symptoms.  Aim for 15 grams of protein for breakfast Consider adding a handful of walnuts to your oatmeal 2 Eggs (whites are fine), fruit or whole grain toast Greek yogurt  3x per week think about including fish Carmie Kanner is a great source of Omega 3 [place] Gum is another source (tuna, and grilled cheese) Walnuts, flaxseed, chia seeds  Continue using vegetables as snacks.   Keep a journal of IBS symptoms and what you ate and drank the last day or two.

## 2022-10-29 NOTE — Progress Notes (Signed)
Medical Nutrition Therapy  Appt Start Time: 1400    End Time: 1500   NUTRITION ASSESSMENT   Anthropometrics  Not assessed   Clinical Medical Hx: IBS, PCOS, prediabetes  Medications: reviewed Labs: A1c 5.8% (was 6.2%) Notable Signs/Symptoms: PCOS mood swings, monthly cramping, legs ache. IBS: diarrhea/constipation, bloating.  Lifestyle & Dietary Hx Pt states she was diagnosed with PCOS this year. Pt states when she was 19 bled for a year straight and prior to that only had a period 2x year. Pt states she started birth control to regulate cycle, recently started [employer]. Pt reports PMS sxs even though is not bleeding, but still going through the hormonal changes.  Pt reports she tried [facility] for 6 months but did not feel it was effective to keep cutting calories to unsustainable level and did not feel it was addressing PCOS.  Pt states she was able to reduce her A1c by cutting back bread, sugar and sweetened beverages, less greasy foods, eating more fruit and vegetables. Pt states fruits are easier because she doesn't always want to cook.   Physical activity: 1 hr 45 min of walking dog per day.  Eating environment:  Breakfast and lunch: at a table in the break room at work At home eats at the table, Pt states she eats mindfully and listens to body's cues. Pt reports snacking on couch while watching TV.  Pt reports last IBS sxs 2 weeks ago, started with bloating and constipation and then turned into diarrhea. Pt states she is not sure what triggered.  Estimated daily fluid intake: M-F 130+ oz, weekends 80 oz Supplements: B12, Multivitamin, D3 Sleep: 10 pm - 6 am (later 11:30 50% of the time) Stress/self-care: not assessed Current average weekly physical activity: 3x/day walk dog  24-Hr Dietary Recall First Meal: non-fat Greek yogurt, apple Snack: chaboni flip yogurt, bagel crisps Second Meal: Chicken boars head, Broccoli slaw, pasta. Snack: none Third Meal: ground beef,  green beans, mac and cheese Snack: none Beverages: water, sweet tea occasionally, wine 1x month  NUTRITION DIAGNOSIS  NB-1.1 Food and nutrition-related knowledge deficit As related to role of protein at breakfast for PCOS.  As evidenced by breakfast not meeting protein recommendations.  NUTRITION INTERVENTION: Education topics include Insulin / Glucose role in energy production PCOS pathophysiology Protein breakfast Non-hunger eating (snacking at night) Sleep   Handouts Provided Include  American heart Association How to get healthy sleep  Learning Style & Readiness for Change Teaching method utilized: Visual & Auditory  Demonstrated degree of understanding via: Teach Back  Barriers to learning/adherence to lifestyle change: none  Progress/ Updates on Pt's Goals Consider using your Apple music to have to help you go to sleep https://www.sleepfoundation.org/ look for the 2-week program for better sleep Less TV before bed Magnesium can help with sleep, restless legs and there is some evidence can help with PMS type of symptoms.  Aim for 15 grams of protein for breakfast Consider adding a handful of walnuts to your oatmeal 2 Eggs (whites are fine), fruit or whole grain toast Greek yogurt  3x per week think about including fish Dani Gobble is a great source of Omega 3 Geralyn Flash is another source (tuna, and grilled cheese) Walnuts, flaxseed, chia seeds  Continue using vegetables as snacks.   Keep a journal of IBS symptoms and what you ate and drank the last day or two  MONITORING & EVALUATION Dietary intake, weekly physical activity, and PCOS sxs in 4-6 weeks.

## 2022-11-09 ENCOUNTER — Encounter: Payer: 59 | Admitting: Internal Medicine

## 2022-11-12 ENCOUNTER — Encounter: Payer: Self-pay | Admitting: Internal Medicine

## 2022-11-12 ENCOUNTER — Ambulatory Visit (INDEPENDENT_AMBULATORY_CARE_PROVIDER_SITE_OTHER): Payer: 59 | Admitting: Internal Medicine

## 2022-11-12 VITALS — BP 118/60 | HR 95 | Temp 97.7°F | Ht 63.0 in | Wt 206.0 lb

## 2022-11-12 DIAGNOSIS — Z0000 Encounter for general adult medical examination without abnormal findings: Secondary | ICD-10-CM

## 2022-11-12 DIAGNOSIS — R7303 Prediabetes: Secondary | ICD-10-CM

## 2022-11-12 DIAGNOSIS — F4010 Social phobia, unspecified: Secondary | ICD-10-CM

## 2022-11-12 DIAGNOSIS — J3089 Other allergic rhinitis: Secondary | ICD-10-CM

## 2022-11-12 MED ORDER — ESCITALOPRAM OXALATE 10 MG PO TABS: ORAL_TABLET | ORAL | 3 refills | Status: DC

## 2022-11-12 NOTE — Progress Notes (Unsigned)
   Subjective:   Patient ID: Catherine Freeman, female    DOB: Jul 07, 1991, 31 y.o.   MRN: [id number]  HPI   Review of Systems  Objective:  Physical Exam  Vitals:   11/12/22 1549  BP: 118/60  Pulse: 95  Temp: 97.7 F (36.5 C)  TempSrc: Oral  SpO2: 95%  Weight: 206 lb (93.4 kg)  Height: 5\' 3"  (1.6 m)    Assessment & Plan:

## 2022-11-13 LAB — CBC
HCT: 40.4 % (ref 36.0–46.0)
Hemoglobin: 13.4 g/dL (ref 12.0–15.0)
MCHC: 33.1 g/dL (ref 30.0–36.0)
MCV: 86.1 fl (ref 78.0–100.0)
Platelets: 353 10*3/uL (ref 150.0–400.0)
RBC: 4.69 Mil/uL (ref 3.87–5.11)
RDW: 14.1 % (ref 11.5–15.5)
WBC: 13.3 10*3/uL — ABNORMAL HIGH (ref 4.0–10.5)

## 2022-11-13 LAB — COMPREHENSIVE METABOLIC PANEL
ALT: 20 U/L (ref 0–35)
AST: 19 U/L (ref 0–37)
Albumin: 4.3 g/dL (ref 3.5–5.2)
Alkaline Phosphatase: 111 U/L (ref 39–117)
BUN: 9 mg/dL (ref 6–23)
CO2: 28 mEq/L (ref 19–32)
Calcium: 9.4 mg/dL (ref 8.4–10.5)
Chloride: 100 mEq/L (ref 96–112)
Creatinine, Ser: 0.74 mg/dL (ref 0.40–1.20)
GFR: 106.99 mL/min (ref >60.00)
Glucose, Bld: 100 mg/dL — ABNORMAL HIGH (ref 70–99)
Potassium: 3.9 mEq/L (ref 3.5–5.1)
Sodium: 138 mEq/L (ref 135–145)
Total Bilirubin: 0.2 mg/dL (ref 0.2–1.2)
Total Protein: 7.7 g/dL (ref 6.0–8.3)

## 2022-11-13 LAB — LIPID PANEL
Cholesterol: 187 mg/dL (ref 0–200)
HDL: 42.2 mg/dL (ref >39.00)
LDL Cholesterol: 123 mg/dL — ABNORMAL HIGH (ref 0–99)
NonHDL: 144.42
Total CHOL/HDL Ratio: 4
Triglycerides: 106 mg/dL (ref 0.0–149.0)
VLDL: 21.2 mg/dL (ref 0.0–40.0)

## 2022-11-13 LAB — HEMOGLOBIN A1C: Hgb A1c MFr Bld: 6.1 % (ref 4.6–6.5)

## 2022-11-14 NOTE — Assessment & Plan Note (Signed)
Using xyzal otc to help and well controlled. Continue.

## 2022-11-14 NOTE — Progress Notes (Signed)
   Subjective:   Patient ID: Catherine Freeman, female    DOB: 03/27/1991, 31 y.o.   MRN: [id number]  HPI The patient is here for physical.  PMH, [facility], social history reviewed and updated  Review of Systems  Constitutional: Negative.   HENT: Negative.    Eyes: Negative.   Respiratory:  Negative for cough, chest tightness and shortness of breath.   Cardiovascular:  Negative for chest pain, palpitations and leg swelling.  Gastrointestinal:  Negative for abdominal distention, abdominal pain, constipation, diarrhea, nausea and vomiting.  Musculoskeletal: Negative.   Skin: Negative.   Neurological: Negative.   Psychiatric/Behavioral: Negative.      Objective:  Physical Exam Constitutional:      Appearance: She is well-developed.  HENT:     Head: Normocephalic and atraumatic.  Cardiovascular:     Rate and Rhythm: Normal rate and regular rhythm.  Pulmonary:     Effort: Pulmonary effort is normal. No respiratory distress.     Breath sounds: Normal breath sounds. No wheezing or rales.  Abdominal:     General: Bowel sounds are normal. There is no distension.     Palpations: Abdomen is soft.     Tenderness: There is no abdominal tenderness. There is no rebound.  Musculoskeletal:     Cervical back: Normal range of motion.  Skin:    General: Skin is warm and dry.  Neurological:     Mental Status: She is alert and oriented to person, place, and time.     Coordination: Coordination normal.     Vitals:   11/12/22 1549  BP: 118/60  Pulse: 95  Temp: 97.7 F (36.5 C)  TempSrc: Oral  SpO2: 95%  Weight: 206 lb (93.4 kg)  Height: 5\' 3"  (1.6 m)    Assessment & Plan:

## 2022-11-14 NOTE — Assessment & Plan Note (Signed)
Taking lexapro 10 mg daily and this is helping. Will continue at current dosing.

## 2022-11-14 NOTE — Assessment & Plan Note (Signed)
Checking Hga1c and adjust as needed.  

## 2022-11-14 NOTE — Assessment & Plan Note (Signed)
Flu shot counseled. Covid-19 counseled. Pap smear up to date. Counseled about sun safety and mole surveillance. Counseled about the dangers of distracted driving. Given 10 year screening recommendations.

## 2022-11-14 NOTE — Assessment & Plan Note (Signed)
BMI 36 and complicated by pre-diabetes and PCOS and GERD. Counseled about diet and exercise to help.

## 2022-11-23 ENCOUNTER — Encounter: Payer: Self-pay | Admitting: Internal Medicine

## 2022-11-26 ENCOUNTER — Ambulatory Visit: Payer: 59 | Admitting: Registered"

## 2022-11-26 MED ORDER — METFORMIN HCL ER 500 MG PO TB24: 500.0000 mg | ORAL_TABLET | Freq: Every day | ORAL | 3 refills | Status: DC

## 2022-12-12 ENCOUNTER — Encounter: Payer: 59 | Admitting: Registered"

## 2023-01-07 ENCOUNTER — Ambulatory Visit: Payer: 59 | Admitting: Podiatry

## 2023-01-07 ENCOUNTER — Encounter: Payer: Self-pay | Admitting: Podiatry

## 2023-01-07 VITALS — BP 124/64

## 2023-01-07 DIAGNOSIS — L600 Ingrowing nail: Secondary | ICD-10-CM

## 2023-01-07 NOTE — Progress Notes (Signed)
  Subjective:  Patient ID: Catherine Freeman, female    DOB: 04/12/1991,   MRN: [id number]  Chief Complaint  Patient presents with   Nail Problem    Bilateral hallux nail discomfort pt stated she normally cuts them her self but hasn't in the last 2 weeks and she just wanted to have them looked at     32 y.o. female presents for concern of bilateral hallux ingrown nails. Relates she trims them out herself regularly as they have been going on for years. Relates she is not ready for procedure but wants to know more about how to take care of them.  . Denies any other pedal complaints. Denies n/v/f/c.   Past Medical History:  Diagnosis Date   Allergy    GERD (gastroesophageal reflux disease)    IBS (irritable bowel syndrome)     Objective:  Physical Exam: Vascular: DP/PT pulses 2/4 bilateral. CFT <3 seconds. Normal hair growth on digits. No edema.  Skin. No lacerations or abrasions bilateral feet. Bilateral hallux nails incurvated at bilateral borders. No erythema edema or purulence noted.  Musculoskeletal: MMT 5/5 bilateral lower extremities in DF, PF, Inversion and Eversion. Deceased ROM in DF of ankle joint.  Neurological: Sensation intact to light touch.   Assessment:   1. Ingrown nail of great toe of right foot   2. Ingrown nail of great toe of left foot      Plan:  Patient was evaluated and treated and all questions answered. Discussed ingrown nails and treatment options.  Patient would like to defer procedure at this time and will plan for procedure at a future date.  Discussed soaks and neosporin in the meantime as well as using floss to guide ingrown out.  Patient to return as needed.   Lorenda Peck, DPM

## 2023-01-29 ENCOUNTER — Ambulatory Visit: Payer: 59 | Admitting: Family Medicine

## 2023-02-12 ENCOUNTER — Ambulatory Visit: Payer: 59 | Admitting: Internal Medicine

## 2023-02-12 ENCOUNTER — Encounter: Payer: Self-pay | Admitting: Internal Medicine

## 2023-02-12 VITALS — BP 114/80 | HR 82 | Temp 98.0°F | Ht 63.0 in | Wt 203.0 lb

## 2023-02-12 DIAGNOSIS — J069 Acute upper respiratory infection, unspecified: Secondary | ICD-10-CM | POA: Diagnosis not present

## 2023-02-12 NOTE — Progress Notes (Signed)
   Subjective:   Patient ID: Catherine Freeman, female    DOB: 09-02-1991, 32 y.o.   MRN: [id number]  HPI The patient is a 32 YO female coming in for sick visit.   Review of Systems  Constitutional:  Negative for activity change, appetite change, chills, fatigue, fever and unexpected weight change.  HENT:  Positive for congestion, postnasal drip and rhinorrhea. Negative for ear discharge, ear pain, sinus pressure, sinus pain, sneezing, sore throat, tinnitus, trouble swallowing and voice change.   Eyes: Negative.   Respiratory:  Negative for cough, chest tightness, shortness of breath and wheezing.   Cardiovascular: Negative.   Gastrointestinal: Negative.   Musculoskeletal:  Positive for myalgias.  Neurological: Negative.     Objective:  Physical Exam Constitutional:      Appearance: She is well-developed.  HENT:     Head: Normocephalic and atraumatic.  Cardiovascular:     Rate and Rhythm: Normal rate and regular rhythm.  Pulmonary:     Effort: Pulmonary effort is normal. No respiratory distress.     Breath sounds: Normal breath sounds. No wheezing or rales.  Abdominal:     General: Bowel sounds are normal. There is no distension.     Palpations: Abdomen is soft.     Tenderness: There is no abdominal tenderness. There is no rebound.  Musculoskeletal:     Cervical back: Normal range of motion.  Skin:    General: Skin is warm and dry.  Neurological:     Mental Status: She is alert and oriented to person, place, and time.     Coordination: Coordination normal.     Vitals:   02/12/23 1037  BP: 114/80  Pulse: 82  Temp: 98 F (36.7 C)  TempSrc: Oral  SpO2: 98%  Weight: 203 lb (92.1 kg)  Height: 5' 3"$  (1.6 m)    Assessment & Plan:  Visit time 18 minutes in face to face communication with patient and coordination of care, additional 2 minutes spent in record review, coordination or care, ordering tests, communicating/referring to other healthcare professionals, documenting in  medical records all on the same day of the visit for total time 20 minutes spent on the visit.

## 2023-02-12 NOTE — Assessment & Plan Note (Signed)
Overall symptoms mild started 2-3 days ago. No POC testing done and encouraged to use otc to help.

## 2023-04-15 ENCOUNTER — Encounter: Payer: Self-pay | Admitting: Internal Medicine

## 2023-04-15 ENCOUNTER — Telehealth: Payer: Self-pay | Admitting: Internal Medicine

## 2023-04-15 NOTE — Telephone Encounter (Signed)
Patient dropped off document  Patient Data Forms , to be filled out by provider. Patient requested to send it via Fax within 7-days. Document is located in providers tray at front office.Please advise at Mobile [phone] (mobile)   Please fax to: [phone]  Please also call pt for pickup:  [phone]

## 2023-04-15 NOTE — Telephone Encounter (Signed)
Forms placed in providers office to be signed.

## 2023-07-31 ENCOUNTER — Encounter: Payer: Self-pay | Admitting: Internal Medicine

## 2023-08-12 ENCOUNTER — Encounter: Payer: Self-pay | Admitting: Internal Medicine

## 2023-08-12 ENCOUNTER — Ambulatory Visit: Payer: 59 | Admitting: Internal Medicine

## 2023-08-14 MED ORDER — NITROGLYCERIN 0.4 % RE OINT: TOPICAL_OINTMENT | RECTAL | 0 refills | Status: DC

## 2023-11-18 ENCOUNTER — Ambulatory Visit: Payer: 59 | Admitting: Internal Medicine

## 2023-11-18 ENCOUNTER — Encounter: Payer: Self-pay | Admitting: Internal Medicine

## 2023-11-18 VITALS — BP 120/84 | HR 79 | Temp 98.5°F | Ht 63.0 in | Wt 201.0 lb

## 2023-11-18 DIAGNOSIS — Z0000 Encounter for general adult medical examination without abnormal findings: Secondary | ICD-10-CM

## 2023-11-18 DIAGNOSIS — E282 Polycystic ovarian syndrome: Secondary | ICD-10-CM

## 2023-11-18 DIAGNOSIS — K219 Gastro-esophageal reflux disease without esophagitis: Secondary | ICD-10-CM

## 2023-11-18 DIAGNOSIS — R7303 Prediabetes: Secondary | ICD-10-CM | POA: Diagnosis not present

## 2023-11-18 DIAGNOSIS — Z6835 Body mass index (BMI) 35.0-35.9, adult: Secondary | ICD-10-CM

## 2023-11-18 DIAGNOSIS — F4010 Social phobia, unspecified: Secondary | ICD-10-CM

## 2023-11-18 LAB — COMPREHENSIVE METABOLIC PANEL WITH GFR
ALT: 16 U/L (ref 0–35)
AST: 17 U/L (ref 0–37)
Albumin: 4.7 g/dL (ref 3.5–5.2)
Alkaline Phosphatase: 124 U/L — ABNORMAL HIGH (ref 39–117)
BUN: 4 mg/dL — ABNORMAL LOW (ref 6–23)
CO2: 30 meq/L (ref 19–32)
Calcium: 10 mg/dL (ref 8.4–10.5)
Chloride: 98 meq/L (ref 96–112)
Creatinine, Ser: 0.65 mg/dL (ref 0.40–1.20)
GFR: 115.61 mL/min
Glucose, Bld: 77 mg/dL (ref 70–99)
Potassium: 4.2 meq/L (ref 3.5–5.1)
Sodium: 135 meq/L (ref 135–145)
Total Bilirubin: 0.3 mg/dL (ref 0.2–1.2)
Total Protein: 8.4 g/dL — ABNORMAL HIGH (ref 6.0–8.3)

## 2023-11-18 LAB — CBC
HCT: 41.1 % (ref 36.0–46.0)
Hemoglobin: 13.2 g/dL (ref 12.0–15.0)
MCHC: 32.2 g/dL (ref 30.0–36.0)
MCV: 88.3 fl (ref 78.0–100.0)
Platelets: 396 10*3/uL (ref 150.0–400.0)
RBC: 4.66 Mil/uL (ref 3.87–5.11)
RDW: 13.8 % (ref 11.5–15.5)
WBC: 11.1 10*3/uL — ABNORMAL HIGH (ref 4.0–10.5)

## 2023-11-18 LAB — LIPID PANEL
Cholesterol: 214 mg/dL — ABNORMAL HIGH (ref 0–200)
HDL: 36.5 mg/dL — ABNORMAL LOW
LDL Cholesterol: 142 mg/dL — ABNORMAL HIGH (ref 0–99)
NonHDL: 177.55
Total CHOL/HDL Ratio: 6
Triglycerides: 180 mg/dL — ABNORMAL HIGH (ref 0.0–149.0)
VLDL: 36 mg/dL (ref 0.0–40.0)

## 2023-11-18 LAB — HEMOGLOBIN A1C: Hgb A1c MFr Bld: 6 % (ref 4.6–6.5)

## 2023-11-18 MED ORDER — ESCITALOPRAM OXALATE 10 MG PO TABS: ORAL_TABLET | ORAL | 3 refills | Status: DC

## 2023-11-18 MED ORDER — METFORMIN HCL ER 500 MG PO TB24: 500.0000 mg | ORAL_TABLET | Freq: Every day | ORAL | 3 refills | Status: DC

## 2023-11-18 NOTE — Progress Notes (Unsigned)
   Subjective:   Patient ID: Catherine Freeman, female    DOB: 1991-10-06, 32 y.o.   MRN: [id number]  HPI The patient is here for physical.  PMH, [facility], social history reviewed and updated  Review of Systems  Objective:  Physical Exam  Vitals:   11/18/23 1440  BP: 120/84  Pulse: 79  Temp: 98.5 F (36.9 C)  TempSrc: Oral  SpO2: 99%  Weight: 201 lb (91.2 kg)  Height: 5\' 3"  (1.6 m)    Assessment & Plan:

## 2023-11-19 NOTE — Assessment & Plan Note (Signed)
Controlled off medication for now with diet.

## 2023-11-19 NOTE — Assessment & Plan Note (Signed)
Taking spironolactone and has IUD currently.

## 2023-11-19 NOTE — Assessment & Plan Note (Addendum)
Checking Hga1c and adjust as needed. Still taking metformin and weight has been decreasing.

## 2023-11-19 NOTE — Assessment & Plan Note (Signed)
Flu shot declines. Tetanus up to date. Pap smear up to date with gyn recent colposcopy. Counseled about sun safety and mole surveillance. Counseled about the dangers of distracted driving. Given 10 year screening recommendations.

## 2023-11-19 NOTE — Assessment & Plan Note (Signed)
Using buspar and lexapro and controlled adequately.

## 2023-11-19 NOTE — Assessment & Plan Note (Signed)
BMI 35 and complicated by pre-diabetes and PCOS and GERD. She is working on diet and exercise and is down 5 pounds since last year. Given encouragement.

## 2023-12-08 ENCOUNTER — Encounter: Payer: Self-pay | Admitting: Internal Medicine

## 2023-12-09 NOTE — Telephone Encounter (Signed)
Please advise as MD is out of office

## 2023-12-27 ENCOUNTER — Telehealth: Payer: 59 | Admitting: Internal Medicine

## 2024-04-02 ENCOUNTER — Encounter: Payer: Self-pay | Admitting: Internal Medicine

## 2024-06-12 ENCOUNTER — Telehealth: Admitting: Physician Assistant

## 2024-06-12 DIAGNOSIS — H6991 Unspecified Eustachian tube disorder, right ear: Secondary | ICD-10-CM

## 2024-06-12 MED ORDER — FLUTICASONE PROPIONATE 50 MCG/ACT NA SUSP: 2.0000 | Freq: Every day | NASAL | 0 refills | Status: DC

## 2024-06-12 NOTE — Progress Notes (Signed)
 E-Visit for Ear Pain - Eustachian Tube Dysfunction   We are sorry that you are not feeling well. Here is how we plan to help!  Based on what you have shared with me it looks like you have Eustachian Tube Dysfunction.  Eustachian Tube Dysfunction is a condition where the tubes that connect your middle ears to your upper throat become blocked. This can lead to discomfort, hearing difficulties and a feeling of fullness in your ear. Eustachian tube dysfunction usually resolves itself in a few days. The usual symptoms include: Hearing problems Tinnitus, or ringing in your ears Clicking or popping sounds A feeling of fullness in your ears Pain that mimics an ear infection Dizziness, vertigo or balance problems A "tickling" sensation in your ears  ?Eustachian tube dysfunction symptoms may get worse in higher altitudes. This is called barotrauma, and it can happen while scuba diving, flying in an airplane or driving in the mountains.   What causes eustachian tube dysfunction? Allergies and infections (like the common cold and the flu) are the most common causes of eustachian tube dysfunction. These conditions can cause inflammation and mucus buildup, leading to blockage. GERD, or chronic acid reflux, can also cause ETD. This is because stomach acid can back up into your throat and result in inflammation. As mentioned above, altitude changes can also cause ETD.   What are some common eustachian tube dysfunction treatments? In most cases, treatment isn't necessary because ETD often resolves on its own. However, you might need treatment if your symptoms linger for more than two weeks.    Eustachian tube dysfunction treatment depends on the cause and the severity of your condition. Treatments may include home remedies, medications or, in severe cases, surgery.     HOME CARE: Sometimes simple home remedies can help with mild cases of eustachian tube dysfunction. To try and clear the blockage, you  can: Chew gum. Yawn. Swallow. Try the Valsalva maneuver (breathing out forcefully while closing your mouth and pinching your nostrils). Use a saline spray to clear out nasal passages.  MEDICATIONS: Over-the-counter medications can help if allergies are causing eustachian tube dysfunction. Try antihistamines (like cetirizine or diphenhydramine) to ease your symptoms. If you have discomfort, pain relievers -- such as acetaminophen or ibuprofen -- can help.  Sometimes intranasal glucocorticosteroids (like Flonase or Nasacort) help.  I have prescribed Fluticasone 50 mcg/spray 2 sprays in each nostril daily for 10-14 days  There is a high possibility it is an ear wax build up as well, but that would need to be determined in person. You can try the flonase for a few days to see if improving, but if not it may be worthwhile to be seen to see if it is ear wax.    GET HELP RIGHT AWAY IF: Fever is over 102.2 degrees. You develop progressive ear pain or hearing loss. Ear symptoms persist longer than 3 days after treatment.  MAKE SURE YOU: Understand these instructions. Will watch your condition. Will get help right away if you are not doing well or get worse.  Thank you for choosing an e-visit.  Your e-visit answers were reviewed by a board certified advanced clinical practitioner to complete your personal care plan. Depending upon the condition, your plan could have included both over the counter or prescription medications.  Please review your pharmacy choice. Make sure the pharmacy is open so you can pick up the prescription now. If there is a problem, you may contact your provider through [employer] and have  the prescription routed to another pharmacy.  Your safety is important to us . If you have drug allergies check your prescription carefully.   For the next 24 hours you can use MyChart to ask questions about today's visit, request a non-urgent call back, or ask for a work or school  excuse. You will get an email with a survey after your eVisit asking about your experience. We would appreciate your feedback. I hope that your e-visit has been valuable and will aid in your recovery.      I have spent 5 minutes in review of e-visit questionnaire, review and updating patient chart, medical decision making and response to patient.   Delon CHRISTELLA Dickinson, PA-C

## 2024-11-22 ENCOUNTER — Other Ambulatory Visit: Payer: Self-pay | Admitting: Internal Medicine

## 2024-11-23 ENCOUNTER — Encounter: Payer: Self-pay | Admitting: Internal Medicine

## 2024-11-23 ENCOUNTER — Ambulatory Visit: Admitting: Internal Medicine

## 2024-11-23 ENCOUNTER — Ambulatory Visit: Payer: Self-pay | Admitting: Internal Medicine

## 2024-11-23 VITALS — BP 110/60 | HR 91 | Temp 98.5°F | Ht 63.0 in | Wt 192.0 lb

## 2024-11-23 DIAGNOSIS — R7303 Prediabetes: Secondary | ICD-10-CM

## 2024-11-23 DIAGNOSIS — E282 Polycystic ovarian syndrome: Secondary | ICD-10-CM

## 2024-11-23 DIAGNOSIS — K219 Gastro-esophageal reflux disease without esophagitis: Secondary | ICD-10-CM

## 2024-11-23 DIAGNOSIS — F4010 Social phobia, unspecified: Secondary | ICD-10-CM

## 2024-11-23 DIAGNOSIS — Z6834 Body mass index (BMI) 34.0-34.9, adult: Secondary | ICD-10-CM

## 2024-11-23 DIAGNOSIS — Z0000 Encounter for general adult medical examination without abnormal findings: Secondary | ICD-10-CM

## 2024-11-23 LAB — CBC
HCT: 41.3 % (ref 36.0–46.0)
Hemoglobin: 14 g/dL (ref 12.0–15.0)
MCHC: 33.9 g/dL (ref 30.0–36.0)
MCV: 89 fl (ref 78.0–100.0)
Platelets: 335 K/uL (ref 150.0–400.0)
RBC: 4.64 Mil/uL (ref 3.87–5.11)
RDW: 12.6 % (ref 11.5–15.5)
WBC: 12.1 K/uL — ABNORMAL HIGH (ref 4.0–10.5)

## 2024-11-23 LAB — COMPREHENSIVE METABOLIC PANEL WITH GFR
ALT: 13 U/L (ref 0–35)
AST: 13 U/L (ref 0–37)
Albumin: 4.3 g/dL (ref 3.5–5.2)
Alkaline Phosphatase: 86 U/L (ref 39–117)
BUN: 9 mg/dL (ref 6–23)
CO2: 29 meq/L (ref 19–32)
Calcium: 9.7 mg/dL (ref 8.4–10.5)
Chloride: 99 meq/L (ref 96–112)
Creatinine, Ser: 0.66 mg/dL (ref 0.40–1.20)
GFR: 114.36 mL/min (ref >60.00)
Glucose, Bld: 96 mg/dL (ref 70–99)
Potassium: 4.3 meq/L (ref 3.5–5.1)
Sodium: 136 meq/L (ref 135–145)
Total Bilirubin: 0.2 mg/dL (ref 0.2–1.2)
Total Protein: 7.8 g/dL (ref 6.0–8.3)

## 2024-11-23 LAB — HEMOGLOBIN A1C: Hgb A1c MFr Bld: 5.7 % (ref 4.6–6.5)

## 2024-11-23 LAB — LIPID PANEL
Cholesterol: 172 mg/dL (ref 0–200)
HDL: 41.1 mg/dL (ref >39.00)
LDL Cholesterol: 105 mg/dL — ABNORMAL HIGH (ref 0–99)
NonHDL: 131
Total CHOL/HDL Ratio: 4
Triglycerides: 128 mg/dL (ref 0.0–149.0)
VLDL: 25.6 mg/dL (ref 0.0–40.0)

## 2024-11-23 NOTE — Assessment & Plan Note (Signed)
 Using IUD and OCP currently and ob/gyn is adjusting.

## 2024-11-23 NOTE — Assessment & Plan Note (Signed)
 Checking HgA1c and adjust as needed.

## 2024-11-23 NOTE — Assessment & Plan Note (Signed)
 Weight down about 10 pounds since last year counseled about diet and exercise.

## 2024-11-23 NOTE — Assessment & Plan Note (Signed)
 Controlled overall, loss of dog and mom this summer. She is using coping skills and continue lexapro  10 mg daily.

## 2024-11-23 NOTE — Assessment & Plan Note (Signed)
Controlled today off meds.

## 2024-11-23 NOTE — Assessment & Plan Note (Signed)
Flu shot counseled. Tetanus up to date. Pap smear up to date with gyn. Counseled about sun safety and mole surveillance. Counseled about the dangers of distracted driving. Given 10 year screening recommendations.

## 2024-11-23 NOTE — Progress Notes (Signed)
   Subjective:   Patient ID: Catherine Freeman, female    DOB: [birth year], 33 y.o.   MRN: [id number]  The patient is here for physical. Pertinent topics discussed: Discussed the use of AI scribe software for clinical note transcription with the patient, who gave verbal consent to proceed.  History of Present Illness Catherine Freeman is a 33 year old female who presents  She experiences severe menstrual cramps described as 'contraction levels' that have led to vomiting. She has been using a birth control pill in addition to her IUD to manage these symptoms through ob/gyn. Recently, the cramps have become less frequent and the pain has been minimal during her last cycle.  She is on Lexapro  10 mg daily for depression, which she feels is managing her symptoms adequately. Her eating habits are inconsistent, particularly when her depression worsens, but she tries to maintain nutrition with popsicles when she struggles to eat.  She is slowly increasing her physical activity, using the [employer] app provided by her job for daily exercises, which she finds helpful for stress and back alignment. She is not currently attending gym classes but is working on being more consistent with her exercise routine.  PMH, [facility], social history reviewed and updated  Review of Systems  Constitutional: Negative.   HENT: Negative.    Eyes: Negative.   Respiratory:  Negative for cough, chest tightness and shortness of breath.   Cardiovascular:  Negative for chest pain, palpitations and leg swelling.  Gastrointestinal:  Negative for abdominal distention, abdominal pain, constipation, diarrhea, nausea and vomiting.  Musculoskeletal: Negative.   Skin: Negative.   Neurological: Negative.   Psychiatric/Behavioral: Negative.      Objective:  Physical Exam Constitutional:      Appearance: She is well-developed.  HENT:     Head: Normocephalic and atraumatic.  Cardiovascular:     Rate and Rhythm: Normal rate and regular  rhythm.  Pulmonary:     Effort: Pulmonary effort is normal. No respiratory distress.     Breath sounds: Normal breath sounds. No wheezing or rales.  Abdominal:     General: Bowel sounds are normal. There is no distension.     Palpations: Abdomen is soft.     Tenderness: There is no abdominal tenderness.  Musculoskeletal:     Cervical back: Normal range of motion.  Skin:    General: Skin is warm and dry.  Neurological:     Mental Status: She is alert and oriented to person, place, and time.     Coordination: Coordination normal.     Vitals:   11/23/24 1325  BP: 110/60  Pulse: 91  Temp: 98.5 F (36.9 C)  TempSrc: Oral  SpO2: 99%  Weight: 192 lb (87.1 kg)  Height: 5' 3 (1.6 m)    Assessment & Plan:

## 2024-11-28 ENCOUNTER — Encounter: Payer: Self-pay | Admitting: Internal Medicine

## 2024-12-01 MED ORDER — ESCITALOPRAM OXALATE 10 MG PO TABS: 15.0000 mg | ORAL_TABLET | Freq: Every day | ORAL | 3 refills | Status: AC

## 2024-12-01 MED ORDER — ESCITALOPRAM OXALATE 10 MG PO TABS: 15.0000 mg | ORAL_TABLET | Freq: Every day | ORAL | 3 refills | Status: DC
